# Patient Record
Sex: Male | Born: 1955 | Race: White | Hispanic: No | Marital: Single | Smoking: Former smoker
Health system: Southern US, Community
[De-identification: ages and names within clinical notes are randomized; demographics above are authoritative.]

## PROBLEM LIST (undated history)

## (undated) DIAGNOSIS — Z973 Presence of spectacles and contact lenses: Secondary | ICD-10-CM

## (undated) DIAGNOSIS — Z8719 Personal history of other diseases of the digestive system: Secondary | ICD-10-CM

## (undated) HISTORY — PX: LAPAROSCOPIC INGUINAL HERNIA REPAIR: SUR788

---

## 2001-10-06 ENCOUNTER — Encounter: Admission: RE | Admit: 2001-10-06 | Discharge: 2001-10-06 | Payer: Self-pay | Admitting: Urology

## 2001-10-06 ENCOUNTER — Encounter: Payer: Self-pay | Admitting: Urology

## 2004-06-01 ENCOUNTER — Ambulatory Visit (HOSPITAL_COMMUNITY): Admission: RE | Admit: 2004-06-01 | Discharge: 2004-06-01 | Payer: Self-pay | Admitting: Urology

## 2005-05-17 ENCOUNTER — Encounter: Admission: RE | Admit: 2005-05-17 | Discharge: 2005-05-17 | Payer: Self-pay | Admitting: Gastroenterology

## 2006-06-12 IMAGING — CT CT ABDOMEN W/ CM
1 of 3 series · 14 of 32 positions shown, 19 images · IV contrast (REDICAT/H2O & OMNIPAQUE [ID])
Comparison: None.

CLINICAL DATA: Left lower quadrant pain for one year.  No history of malignancy or relevant prior surgery. 
ABDOMEN CT WITH CONTRAST:
TECHNIQUE: Multidetector CT imaging of the abdomen was performed following the standard protocol during bolus administration of intravenous contrast.
Contrast:  100 cc Omnipaque 300.  Oral contrast was given.
TECHNIQUE: Multidetector CT imaging of the pelvis was performed following the standard protocol during bolus administration of intravenous contrast.

[Series 2: — · axial · 0.70mm/px · z∈[-377,-22]mm · 14 of 81 slices shown, 19 images]
[im 5/81  soft-tissue]
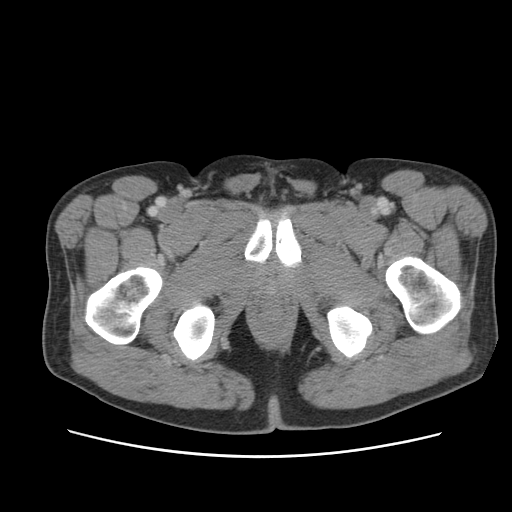
[im 5/81  bone]
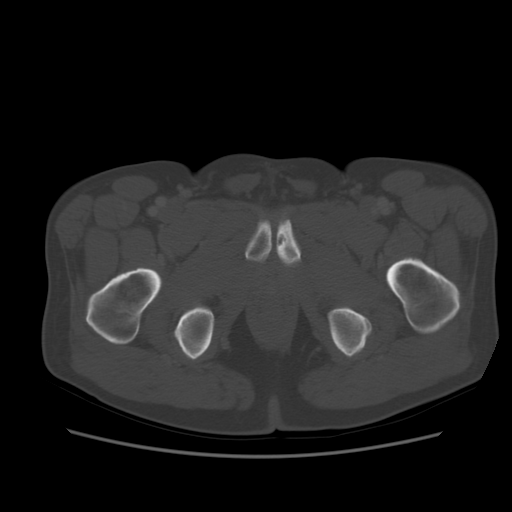
[im 13/81  soft-tissue]
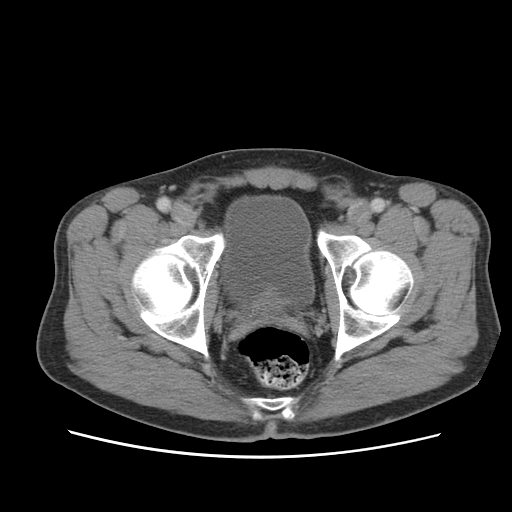
[im 17/81  soft-tissue]
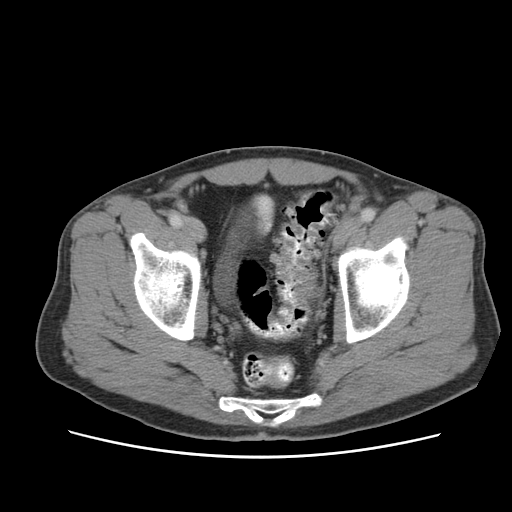
[im 22/81  soft-tissue]
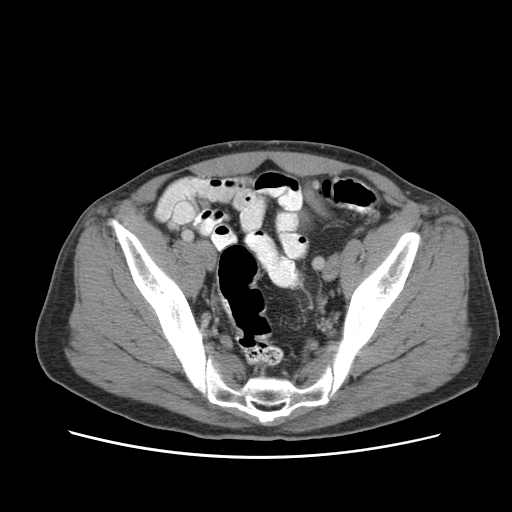
[im 30/81  soft-tissue]
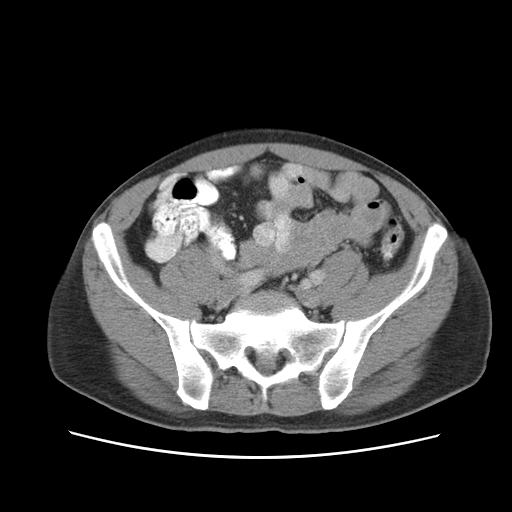
[im 34/81  soft-tissue]
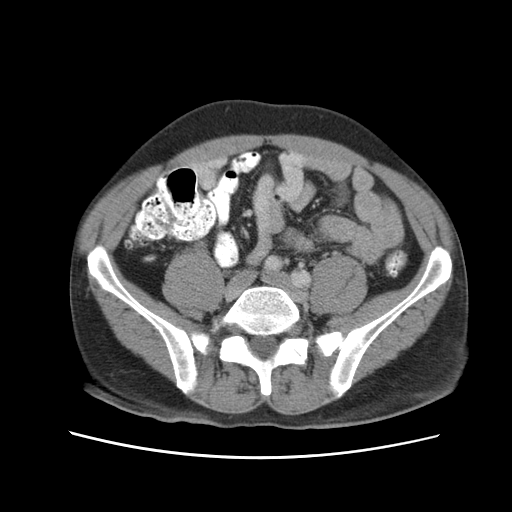
[im 43/81  soft-tissue]
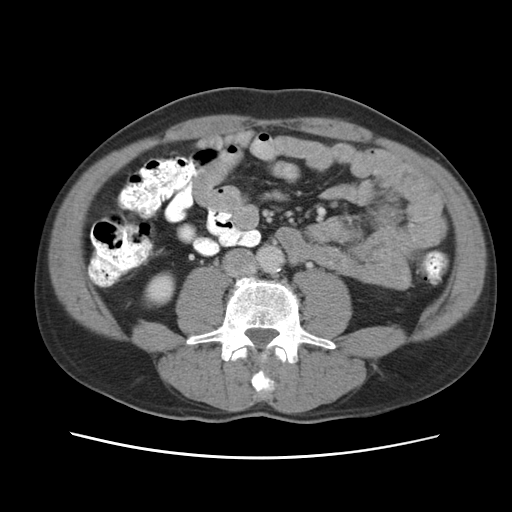
[im 47/81  soft-tissue]
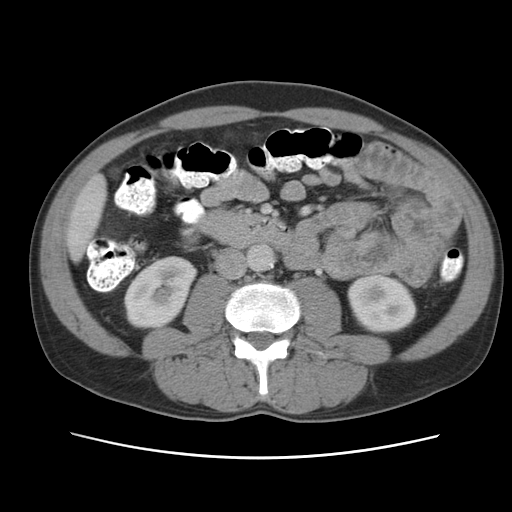
[im 51/81  soft-tissue]
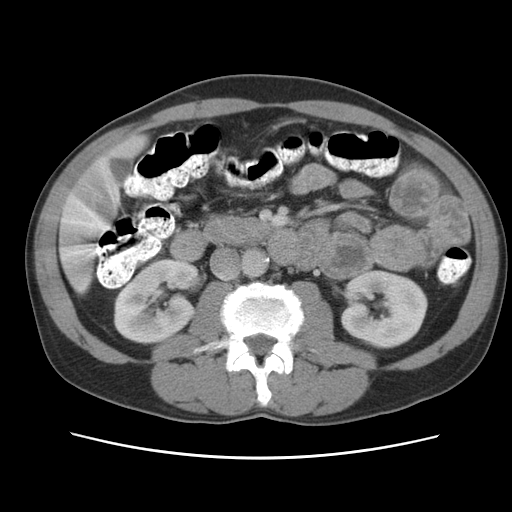
[im 51/81  bone]
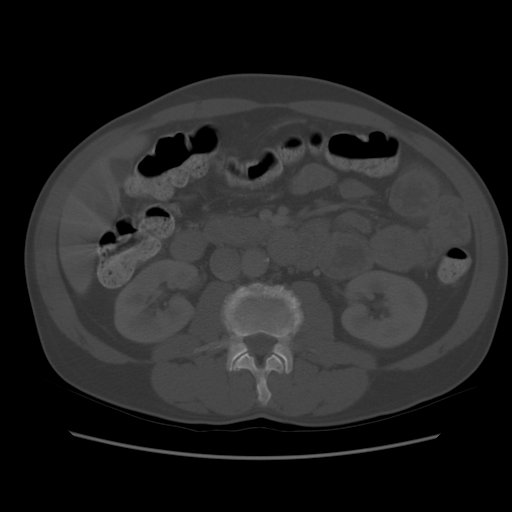
[im 59/81  soft-tissue]
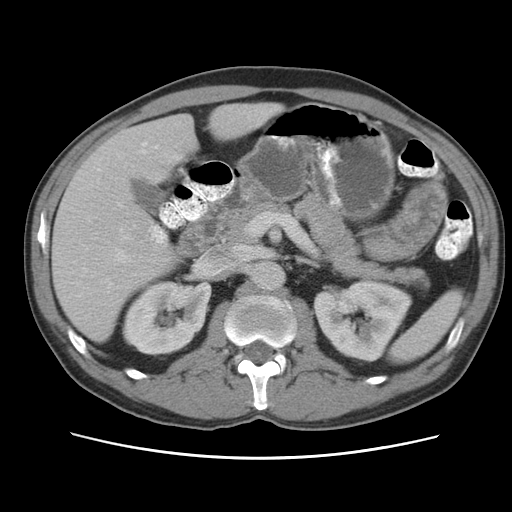
[im 64/81  soft-tissue]
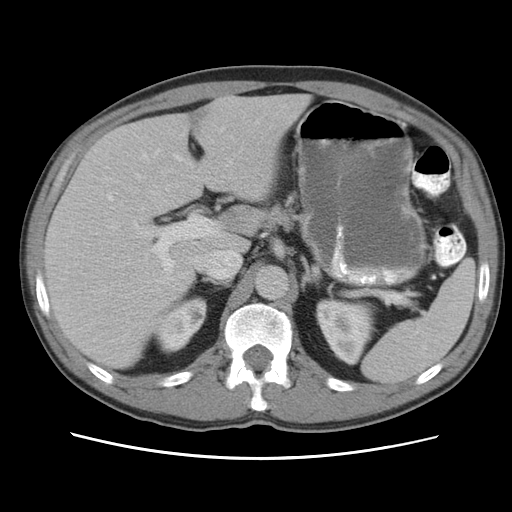
[im 64/81  lung]
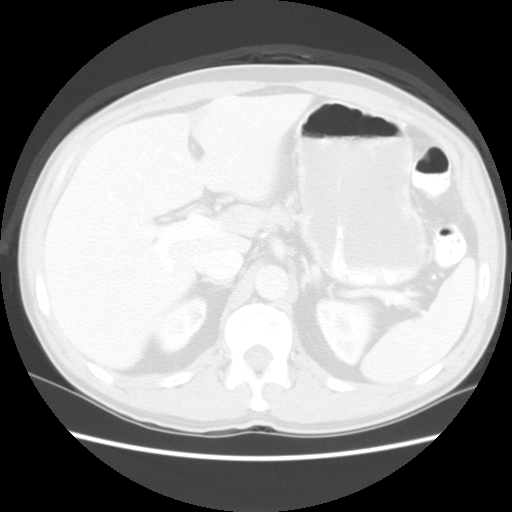
[im 68/81  soft-tissue]
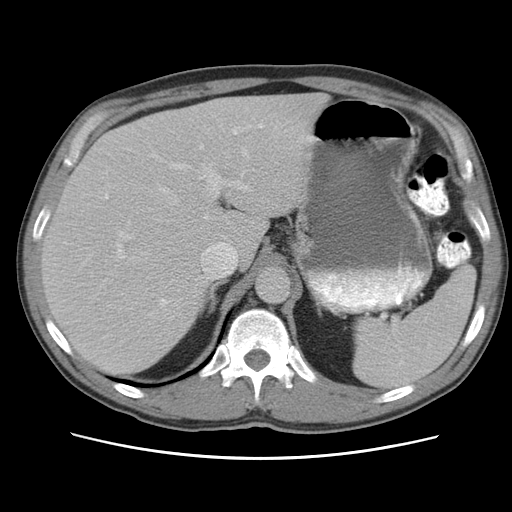
[im 68/81  lung]
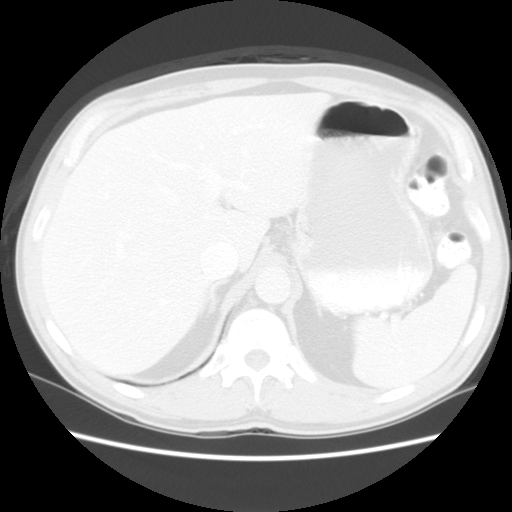
[im 72/81  lung]
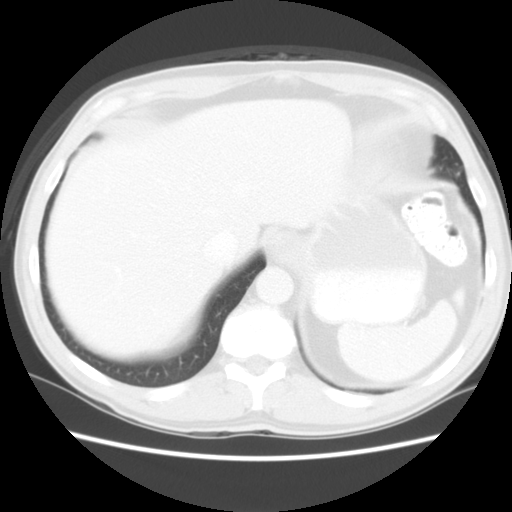
[im 76/81  soft-tissue]
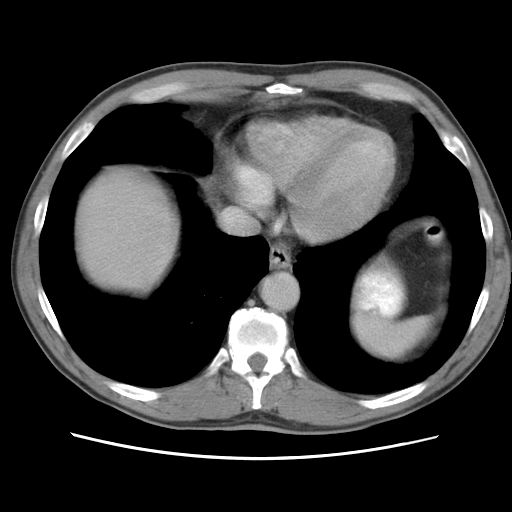
[im 76/81  lung]
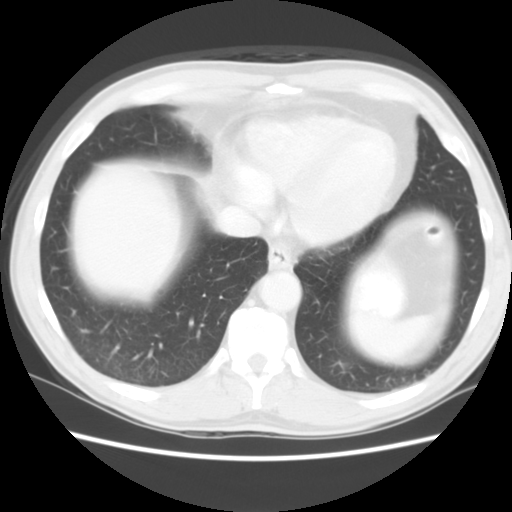

[14 of 32 positions shown; findings below may reference images not displayed]

FINDINGS: Images through the lung bases demonstrate apparent mild fibrotic changes in both lower lobes.  There is no consolidation or pleural effusion.  There is low density in the left hepatic lobe adjacent to the falciform ligament, a typical location for focal fat.  The liver otherwise appears normal.  The spleen, pancreas, gallbladder, and adrenal glands appear normal.  The kidneys appear normal aside from a 5 mm cyst in the upper pole of the right kidney.  There are no enlarged abdominal lymph nodes.  Appendix appears normal.
IMPRESSION: No acute or significant intraabdominal findings.  ild fibrosis at both lung bases. 
PELVIS CT WITH CONTRAST:
FINDINGS: There are moderate diverticular changes of the sigmoid colon with mild mural thickening and mild stranding in the adjacent fat.  Findings are suspicious for mild diverticulitis.  No peridiverticular abscess is identified.  There is no adenopathy.  Prostate gland, seminal vesicles, and urinary bladder appear unremarkable.  There are mild degenerative changes of the symphysis pubis.
IMPRESSION: 1.  Findings compatible with mild sigmoid diverticulitis.  There is no evidence of abscess.  
2.  Followup sigmoidoscopy is suggested to exclude an underlying mucosal lesion given the asymmetric wall thickening.

## 2009-03-21 ENCOUNTER — Encounter: Admission: RE | Admit: 2009-03-21 | Discharge: 2009-03-21 | Payer: Self-pay | Admitting: General Surgery

## 2010-04-16 IMAGING — CR DG CHEST 2V
2 series · 2 of 2 positions shown · non-contrast
Comparison: 06/01/2004

CLINICAL DATA: Preop evaluation, inguinal hernia repair

CHEST - 2 VIEW

[w chest pa]
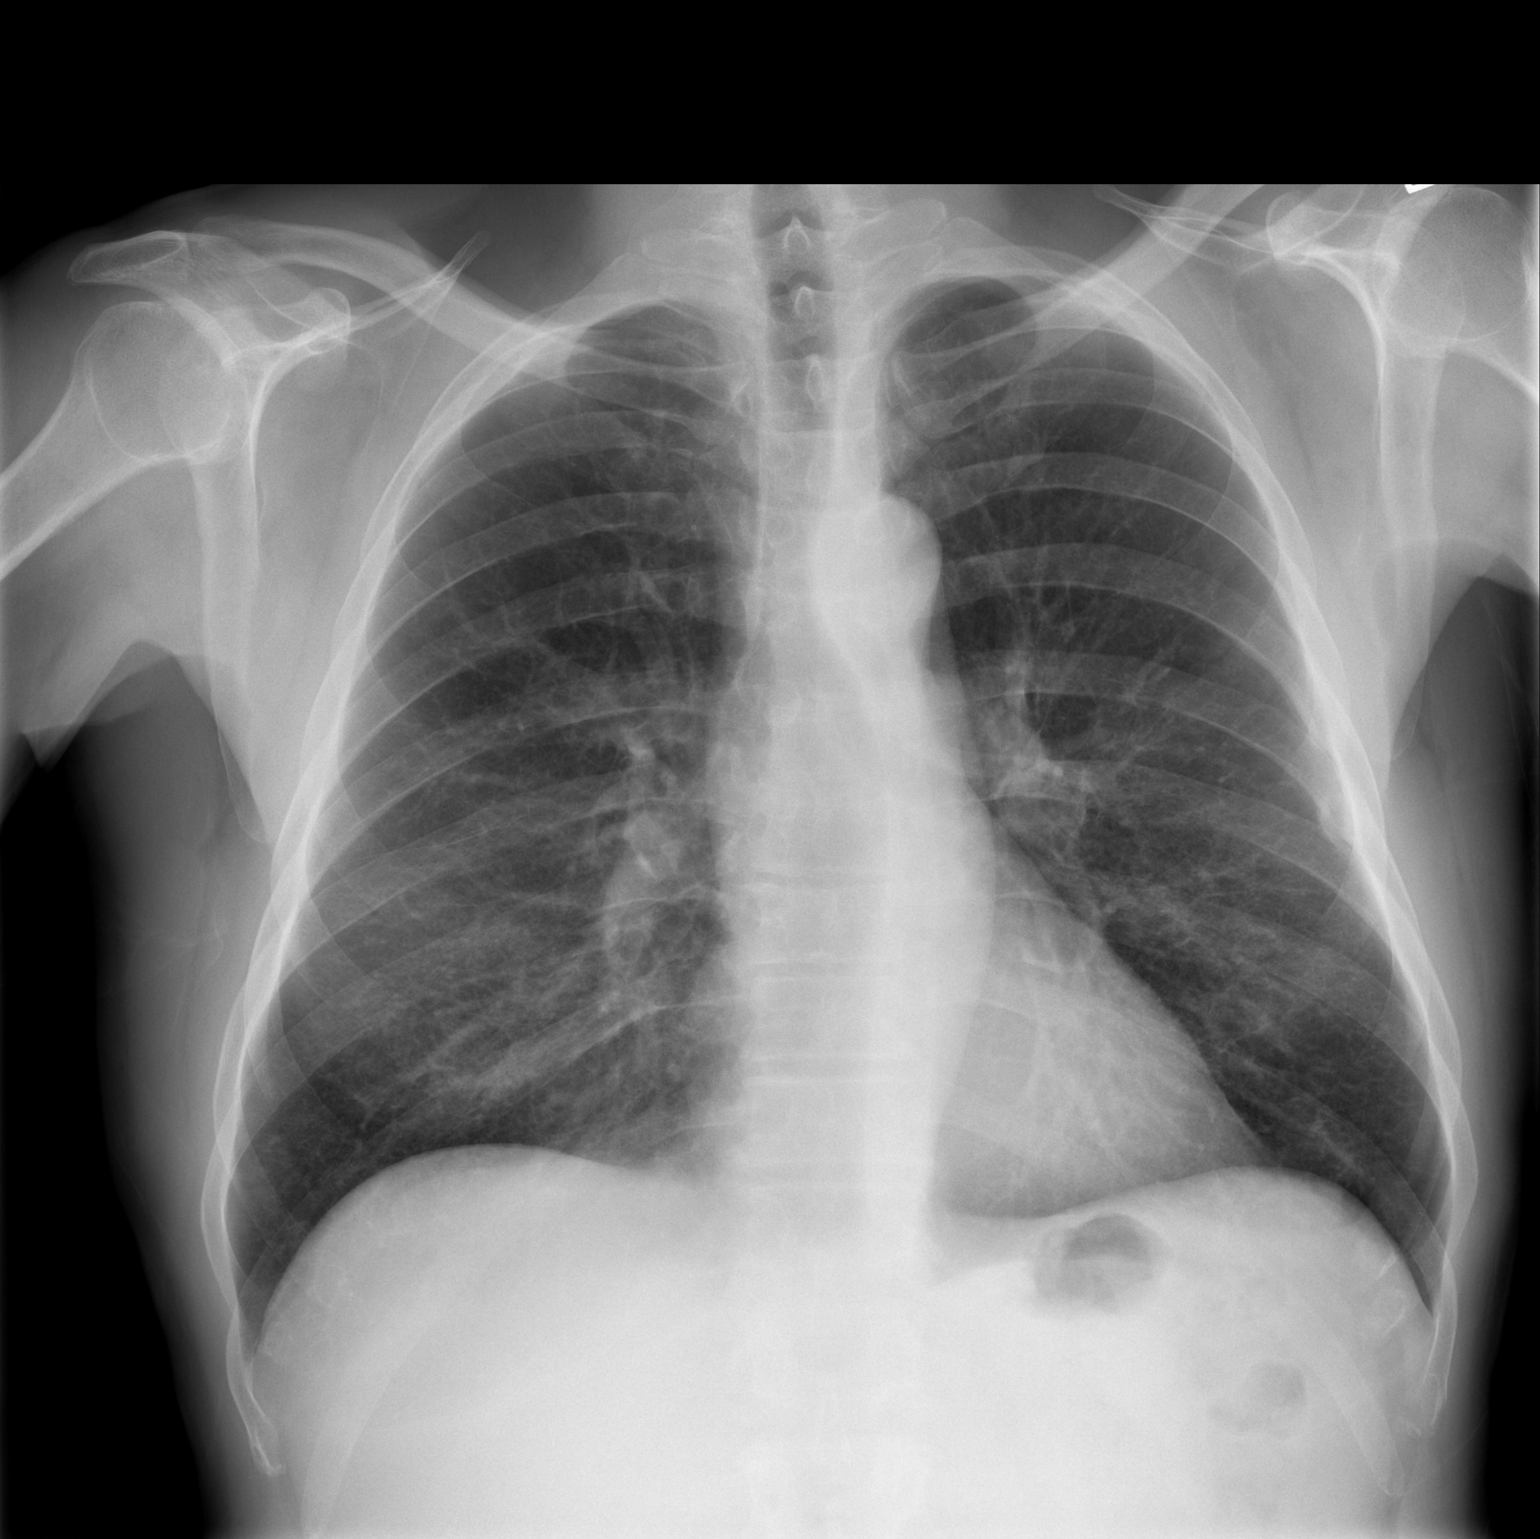

[w chest lat]
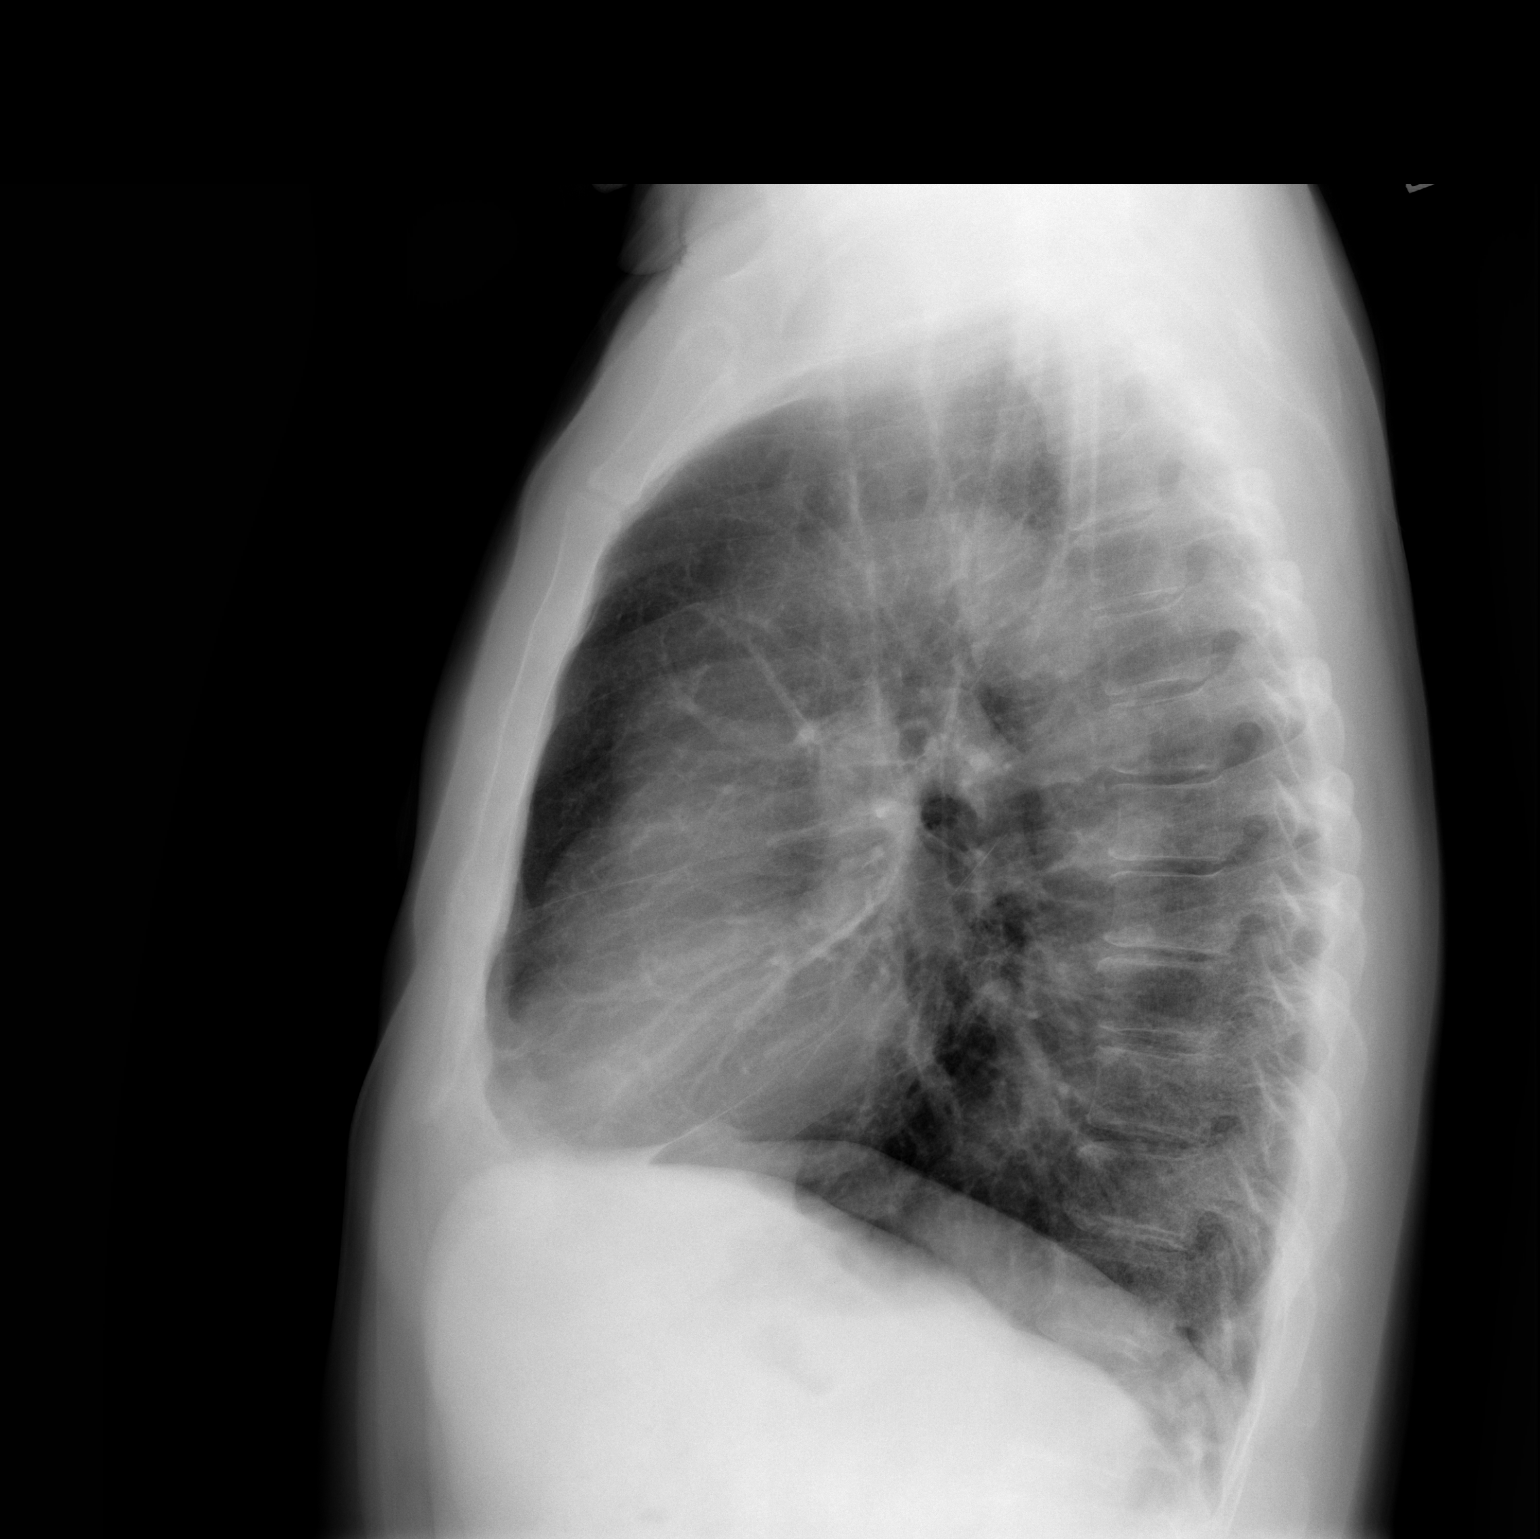

[2 of 2 positions shown; findings below may reference images not displayed]

FINDINGS: The heart size and mediastinal contours are within
normal limits.  Both lungs are clear.  The visualized skeletal
structures are unremarkable. Healing left fifth, sixth and seventh
rib fractures noted.  No pneumothorax or effusion.
IMPRESSION: No active cardiopulmonary disease.

## 2019-08-30 ENCOUNTER — Encounter: Payer: Self-pay | Admitting: Surgery

## 2019-08-30 ENCOUNTER — Ambulatory Visit: Payer: Self-pay | Admitting: Surgery

## 2019-08-30 DIAGNOSIS — K4090 Unilateral inguinal hernia, without obstruction or gangrene, not specified as recurrent: Secondary | ICD-10-CM | POA: Insufficient documentation

## 2019-08-30 NOTE — H&P (Signed)
Marily Memos Documented: 08/30/2019 10:24 AM Location: [facility] Patient #: [id number] DOB: [birth year] Single / Language: Cleophus Molt / Race: White Male  History of Present Illness Adin Hector MD; 08/30/2019 10:52 AM) The patient is a 64 year old male who presents with an inguinal hernia. Note for "Inguinal hernia": ` ` ` Patient sent for surgical consultation at the request of Dr Irine Seal, [facility]  Chief Complaint: Right groin bulging. Probable hernia. ` ` The patient is a pleasant male. Set issues with insurance and usually does not see doctors. However is now aged into to Medicare and is trying to establish with primary and specialty care. He saw urology for an elevated PSA. He used to see Dr. Serita Butcher who has since retired. Switch to Dr. Jeffie Pollock. Noted to have right inguinal hernia. Surgical consultation recommended. Patient had a left inguinal hernia repaired in an open fashion by Dr. Barry Dienes with our group in 2010. He has a distant history of some intermittent abdominal pain concerning for possible diverticulitis although I do not know if it's ever been proven or documented. Certainly not for several years.  Noted that he's had bulging there for several months. He thinks it's gradually gotten larger. Walk a half hour without difficulty. 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 Tries to avoid heavy lifting. He does smoke but has cut back to about 5 cigarettes a day. Usually moves his bowels about twice a day. Her heart attack. No issues with diabetes. No major problems with urination or defecation.  (Review of systems as stated in this history (HPI) or in the review of systems. Otherwise all other 12 point ROS are negative) ` ` `   Past Surgical History (Tanisha A. Owens Shark, [place]; 08/30/2019 10:24 AM) Colon Polyp Removal - Colonoscopy Laparoscopic Inguinal Hernia Surgery Left.  Diagnostic Studies History (Tanisha A. Owens Shark, [place];  08/30/2019 10:24 AM) Colonoscopy >10 years ago  Allergies (Tanisha A. Owens Shark, [place]; 08/30/2019 10:24 AM) No Known Drug Allergies [08/30/2019]: Allergies Reconciled  Medication History (Tanisha A. Owens Shark, [place]; 08/30/2019 10:25 AM) No Current Medications Medications Reconciled  Social History (Tanisha A. Owens Shark, [place]; 08/30/2019 10:24 AM) Alcohol use Occasional alcohol use. Caffeine use Coffee. Illicit drug use Remotely quit drug use. Tobacco use Current some day smoker.  Other Problems (Tanisha A. Owens Shark, [place]; 08/30/2019 10:24 AM) Diverticulosis Inguinal Hernia     Review of Systems (Tanisha A. Brown RMA; 08/30/2019 10:24 AM) General Not Present- Appetite Loss, Chills, Fatigue, Fever, Night Sweats, Weight Gain and Weight Loss. Skin Not Present- Change in Wart/Mole, Dryness, Hives, Jaundice, New Lesions, Non-Healing Wounds, Rash and Ulcer. HEENT Not Present- Earache, Hearing Loss, Hoarseness, Nose Bleed, Oral Ulcers, Ringing in the Ears, Seasonal Allergies, Sinus Pain, Sore Throat, Visual Disturbances, Wears glasses/contact lenses and Yellow Eyes. Respiratory Not Present- Bloody sputum, Chronic Cough, Difficulty Breathing, Snoring and Wheezing. Breast Not Present- Breast Mass, Breast Pain, Nipple Discharge and Skin Changes. Cardiovascular Not Present- Chest Pain, Difficulty Breathing Lying Down, Leg Cramps, Palpitations, Rapid Heart Rate, Shortness of Breath and Swelling of Extremities. Gastrointestinal Not Present- Abdominal Pain, Bloating, Bloody Stool, Change in Bowel Habits, Chronic diarrhea, Constipation, Difficulty Swallowing, Excessive gas, Gets full quickly at meals, Hemorrhoids, Indigestion, Nausea, Rectal Pain and Vomiting. Male Genitourinary Not Present- Blood in Urine, Change in Urinary Stream, Frequency, Impotence, Nocturia, Painful Urination, Urgency and Urine Leakage. Musculoskeletal Not Present- Back Pain, Joint Pain, Joint Stiffness, Muscle Pain, Muscle Weakness and  Swelling of Extremities. Neurological Not Present- Decreased Memory, Fainting,  Headaches, Numbness, Seizures, Tingling, Tremor, Trouble walking and Weakness. Psychiatric Not Present- Anxiety, Bipolar, Change in Sleep Pattern, Depression, Fearful and Frequent crying. Hematology Not Present- Blood Thinners, Easy Bruising, Excessive bleeding, Gland problems, HIV and Persistent Infections.  Vitals (Tanisha A. Brown RMA; 08/30/2019 10:25 AM) 08/30/2019 10:25 AM Weight: 151 lb Height: 68in Body Surface Area: 1.81 m Body Mass Index: 22.96 kg/m  Temp.: 97.45F  Pulse: 101 (Regular)  BP: 126/78 (Sitting, Left Arm, Standard)        Physical Exam Adin Hector MD; 08/30/2019 10:44 AM)  General Mental Status-Alert. General Appearance-Not in acute distress, Not Sickly. Orientation-Oriented X3. Hydration-Well hydrated. Voice-Normal. Note: Pleasant, chatty  Integumentary Global Assessment Upon inspection and palpation of skin surfaces of the - Axillae: non-tender, no inflammation or ulceration, no drainage. and Distribution of scalp and body hair is normal. General Characteristics Temperature - normal warmth is noted.  Head and Neck Head-normocephalic, atraumatic with no lesions or palpable masses. Face Global Assessment - atraumatic, no absence of expression. Neck Global Assessment - no abnormal movements, no bruit auscultated on the right, no bruit auscultated on the left, no decreased range of motion, non-tender. Trachea-midline. Thyroid Gland Characteristics - non-tender.  Eye Eyeball - Left-Extraocular movements intact, No Nystagmus - Left. Eyeball - Right-Extraocular movements intact, No Nystagmus - Right. Cornea - Left-No Hazy - Left. Cornea - Right-No Hazy - Right. Sclera/Conjunctiva - Left-No scleral icterus, No Discharge - Left. Sclera/Conjunctiva - Right-No scleral icterus, No Discharge - Right. Pupil - Left-Direct reaction to  light normal. Pupil - Right-Direct reaction to light normal. Note: Wears glasses. Vision corrected  ENMT Ears Pinna - Left - no drainage observed, no generalized tenderness observed. Pinna - Right - no drainage observed, no generalized tenderness observed. Nose and Sinuses External Inspection of the Nose - no destructive lesion observed. Inspection of the nares - Left - quiet respiration. Inspection of the nares - Right - quiet respiration. Mouth and Throat Lips - Upper Lip - no fissures observed, no pallor noted. Lower Lip - no fissures observed, no pallor noted. Nasopharynx - no discharge present. Oral Cavity/Oropharynx - Tongue - no dryness observed. Oral Mucosa - no cyanosis observed. Hypopharynx - no evidence of airway distress observed.  Chest and Lung Exam Inspection Movements - Normal and Symmetrical. Accessory muscles - No use of accessory muscles in breathing. Palpation Palpation of the chest reveals - Non-tender. Auscultation Breath sounds - Normal and Clear.  Cardiovascular Auscultation Rhythm - Regular. Murmurs & Other Heart Sounds - Auscultation of the heart reveals - No Murmurs and No Systolic Clicks.  Abdomen Inspection Inspection of the abdomen reveals - No Visible peristalsis and No Abnormal pulsations. Umbilicus - No Bleeding, No Urine drainage. Palpation/Percussion Palpation and Percussion of the abdomen reveal - Soft, Non Tender, No Rebound tenderness, No Rigidity (guarding) and No Cutaneous hyperesthesia. Note: Abdomen soft. Nontender. Not distended. No umbilical or incisional hernias. No guarding.  Male Genitourinary Sexual Maturity Tanner 5 - Adult hair pattern and Adult penile size and shape. Note: Obvious right groin bulging going down into upper scrotum consistent with reducible inguinal hernia. Mild laxity and left external ring but no definite recurrent hernia.  Otherwise normal external male genitalia without any testicular  masses.  Peripheral Vascular Upper Extremity Inspection - Left - No Cyanotic nailbeds - Left, Not Ischemic. Inspection - Right - No Cyanotic nailbeds - Right, Not Ischemic.  Neurologic Neurologic evaluation reveals -normal attention span and ability to concentrate, able to name objects and repeat phrases. Appropriate  fund of knowledge , normal sensation and normal coordination. Mental Status Affect - not angry, not paranoid. Cranial Nerves-Normal Bilaterally. Gait-Normal.  Neuropsychiatric Mental status exam performed with findings of-able to articulate well with normal speech/language, rate, volume and coherence, thought content normal with ability to perform basic computations and apply abstract reasoning and no evidence of hallucinations, delusions, obsessions or homicidal/suicidal ideation.  Musculoskeletal Global Assessment Spine, Ribs and Pelvis - no instability, subluxation or laxity. Right Upper Extremity - no instability, subluxation or laxity. Note: Fixed flexion of the right pinky finger  Lymphatic Head & Neck  General Head & Neck Lymphatics: Bilateral - Description - No Localized lymphadenopathy. Axillary  General Axillary Region: Bilateral - Description - No Localized lymphadenopathy. Femoral & Inguinal  Generalized Femoral & Inguinal Lymphatics: Left - Description - No Localized lymphadenopathy. Right - Description - No Localized lymphadenopathy.    Assessment & Plan Adin Hector MD; 08/30/2019 10:53 AM)  RIGHT INGUINAL HERNIA (K40.90) Impression: Obvious right inguinal hernia patient with a prior left inguinal hernia repair.  I think he would benefit from surgery. Reasonable laparoscopic preperitoneal approach. Rule out recurrence on the left side since there is some laxity is there as well. He is interested in proceeding.  Quit smoking to minimize recurrence, pain, infection.   35 minutes spent in chart review, evaluation, examination,  counseling, and coordination of care. More than 50% of that time was spent in counseling.   PREOP - ING HERNIA - ENCOUNTER FOR PREOPERATIVE EXAMINATION FOR GENERAL SURGICAL PROCEDURE (Z01.818)  Current Plans You are being scheduled for surgery- Our schedulers will call you.  You should hear from our office's scheduling department within 5 working days about the location, date, and time of surgery. We try to make accommodations for patient's preferences in scheduling surgery, but sometimes the OR schedule or the surgeon's schedule prevents [place] from making those accommodations.  If you have not heard from our office [phone]) in 5 working days, call the office and ask for your surgeon's nurse.  If you have other questions about your diagnosis, plan, or surgery, call the office and ask for your surgeon's nurse.  Written instructions provided The anatomy & physiology of the abdominal wall and pelvic floor was discussed. The pathophysiology of hernias in the inguinal and pelvic region was discussed. Natural history risks such as progressive enlargement, pain, incarceration, and strangulation was discussed. Contributors to complications such as smoking, obesity, diabetes, prior surgery, etc were discussed.  I feel the risks of no intervention will lead to serious problems that outweigh the operative risks; therefore, I recommended surgery to reduce and repair the hernia. I explained laparoscopic techniques with possible need for an open approach. I noted usual use of mesh to patch and/or buttress hernia repair  Risks such as bleeding, infection, abscess, need for further treatment, heart attack, death, and other risks were discussed. I noted a good likelihood this will help address the problem. Goals of post-operative recovery were discussed as well. Possibility that this will not correct all symptoms was explained. I stressed the importance of low-impact activity, aggressive pain  control, avoiding constipation, & not pushing through pain to minimize risk of post-operative chronic pain or injury. Possibility of reherniation was discussed. We will work to minimize complications.  An educational handout further explaining the pathology & treatment options was given as well. Questions were answered. The patient expresses understanding & wishes to proceed with surgery.  Pt Education - Pamphlet Given - Laparoscopic Hernia Repair: discussed with patient and  provided information. Pt Education - [facility] Pain Control (Genea Rheaume) Pt Education - [facility] Hernia Post-Op HCI (Jahzeel Poythress): discussed with patient and provided information. Pt Education - [facility] Mesh education: discussed with patient and provided information.  TOBACCO ABUSE (Z72.0) Impression: STOP SMOKING!  We talked to the patient about the dangers of smoking. We stressed that tobacco use dramatically increases the risk of peri-operative complications such as infection, tissue necrosis leaving to problems with incision/wound and organ healing, hernia, chronic pain, heart attack, stroke, DVT, pulmonary embolism, and death. We noted there are programs in our community to help stop smoking. Information was available.  Current Plans Pt Education - [facility] STOP SMOKING!

## 2019-10-06 ENCOUNTER — Other Ambulatory Visit: Payer: Self-pay

## 2019-10-06 ENCOUNTER — Encounter (HOSPITAL_BASED_OUTPATIENT_CLINIC_OR_DEPARTMENT_OTHER): Payer: Self-pay | Admitting: Surgery

## 2019-10-06 NOTE — Progress Notes (Signed)
Spoke w/ via phone for pre-op interview--- PT Lab needs dos---- none              Lab results------ no COVID test ------ 10-09-2019 @ 1000 Arrive at ------- [id number] NPO after ------  MN Medications to take morning of surgery ----- NONE Diabetic medication ----- n/a Patient Special Instructions ----- n/a Pre-Op special Istructions ----- n/a Patient verbalized understanding of instructions that were given at this phone interview. Patient denies shortness of breath, chest pain, fever, cough a this phone interview.

## 2019-10-09 ENCOUNTER — Other Ambulatory Visit (HOSPITAL_COMMUNITY)
Admission: RE | Admit: 2019-10-09 | Discharge: 2019-10-09 | Disposition: A | Discharge status: Home / Self Care | Payer: Medicare HMO | Source: Ambulatory Visit | Attending: Surgery | Admitting: Surgery

## 2019-10-09 DIAGNOSIS — Z01812 Encounter for preprocedural laboratory examination: Secondary | ICD-10-CM | POA: Diagnosis present

## 2019-10-09 DIAGNOSIS — Z20822 Contact with and (suspected) exposure to covid-19: Secondary | ICD-10-CM | POA: Insufficient documentation

## 2019-10-09 LAB — SARS CORONAVIRUS 2 (TAT 6-24 HRS): SARS Coronavirus 2: NEGATIVE

## 2019-10-12 MED ORDER — BUPIVACAINE LIPOSOME 1.3 % IJ SUSP
20.0000 mL | INTRAMUSCULAR | Status: DC
  Filled 2019-10-12 (×2): qty 20

## 2019-10-13 ENCOUNTER — Ambulatory Visit (HOSPITAL_BASED_OUTPATIENT_CLINIC_OR_DEPARTMENT_OTHER): Payer: Medicare HMO | Admitting: Anesthesiology

## 2019-10-13 ENCOUNTER — Other Ambulatory Visit: Payer: Self-pay

## 2019-10-13 ENCOUNTER — Ambulatory Visit (HOSPITAL_BASED_OUTPATIENT_CLINIC_OR_DEPARTMENT_OTHER)
Admission: RE | Admit: 2019-10-13 | Discharge: 2019-10-13 | Disposition: A | Discharge status: Home / Self Care | Payer: Medicare HMO | Attending: Surgery | Admitting: Surgery

## 2019-10-13 ENCOUNTER — Encounter (HOSPITAL_BASED_OUTPATIENT_CLINIC_OR_DEPARTMENT_OTHER)
Admission: RE | Disposition: A | Discharge status: Home / Self Care | Payer: Self-pay | Source: Home / Self Care | Attending: Surgery

## 2019-10-13 ENCOUNTER — Encounter (HOSPITAL_BASED_OUTPATIENT_CLINIC_OR_DEPARTMENT_OTHER): Payer: Self-pay | Admitting: Surgery

## 2019-10-13 DIAGNOSIS — D176 Benign lipomatous neoplasm of spermatic cord: Secondary | ICD-10-CM | POA: Insufficient documentation

## 2019-10-13 DIAGNOSIS — K4120 Bilateral femoral hernia, without obstruction or gangrene, not specified as recurrent: Secondary | ICD-10-CM | POA: Insufficient documentation

## 2019-10-13 DIAGNOSIS — F17210 Nicotine dependence, cigarettes, uncomplicated: Secondary | ICD-10-CM | POA: Insufficient documentation

## 2019-10-13 DIAGNOSIS — K4090 Unilateral inguinal hernia, without obstruction or gangrene, not specified as recurrent: Secondary | ICD-10-CM | POA: Diagnosis not present

## 2019-10-13 HISTORY — DX: Personal history of other diseases of the digestive system: Z87.19

## 2019-10-13 HISTORY — PX: INGUINAL HERNIA REPAIR: SHX194

## 2019-10-13 HISTORY — DX: Presence of spectacles and contact lenses: Z97.3

## 2019-10-13 SURGERY — REPAIR, HERNIA, INGUINAL, LAPAROSCOPIC
Anesthesia: General | Site: Abdomen | Laterality: Bilateral

## 2019-10-13 MED ORDER — FENTANYL CITRATE (PF) 250 MCG/5ML IJ SOLN
INTRAMUSCULAR | Status: AC
  Filled 2019-10-13: qty 5

## 2019-10-13 MED ORDER — ACETAMINOPHEN 500 MG PO TABS
1000.0000 mg | ORAL_TABLET | Freq: Once | ORAL | Status: AC
  Administered 2019-10-13: 12:00:00 1000 mg via ORAL
  Filled 2019-10-13: qty 2

## 2019-10-13 MED ORDER — MIDAZOLAM HCL 2 MG/2ML IJ SOLN
INTRAMUSCULAR | Status: AC
  Filled 2019-10-13: qty 2

## 2019-10-13 MED ORDER — LACTATED RINGERS IV SOLN
INTRAVENOUS | Status: DC
  Administered 2019-10-13: 50 mL/h via INTRAVENOUS
  Filled 2019-10-13: qty 1000

## 2019-10-13 MED ORDER — BUPIVACAINE LIPOSOME 1.3 % IJ SUSP
INTRAMUSCULAR | Status: DC | PRN
  Administered 2019-10-13: 20 mL

## 2019-10-13 MED ORDER — GABAPENTIN 300 MG PO CAPS
300.0000 mg | ORAL_CAPSULE | ORAL | Status: AC
  Administered 2019-10-13: 12:00:00 300 mg via ORAL
  Filled 2019-10-13: qty 1

## 2019-10-13 MED ORDER — PROMETHAZINE HCL 25 MG/ML IJ SOLN
6.2500 mg | INTRAMUSCULAR | Status: DC | PRN
  Filled 2019-10-13: qty 1

## 2019-10-13 MED ORDER — OXYCODONE HCL 5 MG PO TABS
ORAL_TABLET | ORAL | Status: AC
  Filled 2019-10-13: qty 1

## 2019-10-13 MED ORDER — GABAPENTIN 300 MG PO CAPS
ORAL_CAPSULE | ORAL | Status: AC
  Filled 2019-10-13: qty 1

## 2019-10-13 MED ORDER — OXYCODONE HCL 5 MG/5ML PO SOLN
5.0000 mg | Freq: Once | ORAL | Status: AC | PRN
  Filled 2019-10-13: qty 5

## 2019-10-13 MED ORDER — HYDROMORPHONE HCL 1 MG/ML IJ SOLN
0.2500 mg | INTRAMUSCULAR | Status: DC | PRN
  Administered 2019-10-13: 0.5 mg via INTRAVENOUS
  Filled 2019-10-13: qty 0.5

## 2019-10-13 MED ORDER — CEFAZOLIN SODIUM-DEXTROSE 2-4 GM/100ML-% IV SOLN
INTRAVENOUS | Status: AC
  Filled 2019-10-13: qty 100

## 2019-10-13 MED ORDER — KETOROLAC TROMETHAMINE 30 MG/ML IJ SOLN
30.0000 mg | Freq: Once | INTRAMUSCULAR | Status: DC | PRN
  Filled 2019-10-13: qty 1

## 2019-10-13 MED ORDER — FENTANYL CITRATE (PF) 100 MCG/2ML IJ SOLN
INTRAMUSCULAR | Status: AC
  Filled 2019-10-13: qty 2

## 2019-10-13 MED ORDER — ONDANSETRON HCL 4 MG/2ML IJ SOLN
INTRAMUSCULAR | Status: DC | PRN
  Administered 2019-10-13: 4 mg via INTRAVENOUS

## 2019-10-13 MED ORDER — HYDROMORPHONE HCL 1 MG/ML IJ SOLN
INTRAMUSCULAR | Status: AC
  Filled 2019-10-13: qty 1

## 2019-10-13 MED ORDER — CHLORHEXIDINE GLUCONATE CLOTH 2 % EX PADS
6.0000 | MEDICATED_PAD | Freq: Once | CUTANEOUS | Status: DC
  Filled 2019-10-13: qty 6

## 2019-10-13 MED ORDER — CEFAZOLIN SODIUM-DEXTROSE 2-4 GM/100ML-% IV SOLN
2.0000 g | INTRAVENOUS | Status: AC
  Administered 2019-10-13: 14:00:00 2 g via INTRAVENOUS
  Filled 2019-10-13: qty 100

## 2019-10-13 MED ORDER — MEPERIDINE HCL 25 MG/ML IJ SOLN
6.2500 mg | INTRAMUSCULAR | Status: DC | PRN
  Filled 2019-10-13: qty 1

## 2019-10-13 MED ORDER — PROPOFOL 10 MG/ML IV BOLUS
INTRAVENOUS | Status: DC | PRN
  Administered 2019-10-13: 120 mg via INTRAVENOUS

## 2019-10-13 MED ORDER — CELECOXIB 200 MG PO CAPS
200.0000 mg | ORAL_CAPSULE | ORAL | Status: AC
  Administered 2019-10-13: 12:00:00 200 mg via ORAL
  Filled 2019-10-13: qty 1

## 2019-10-13 MED ORDER — FENTANYL CITRATE (PF) 250 MCG/5ML IJ SOLN
INTRAMUSCULAR | Status: DC | PRN
  Administered 2019-10-13 (×2): 100 ug via INTRAVENOUS
  Administered 2019-10-13: 50 ug via INTRAVENOUS

## 2019-10-13 MED ORDER — SUGAMMADEX SODIUM 200 MG/2ML IV SOLN
INTRAVENOUS | Status: DC | PRN
  Administered 2019-10-13: 150 mg via INTRAVENOUS

## 2019-10-13 MED ORDER — ACETAMINOPHEN 500 MG PO TABS
1000.0000 mg | ORAL_TABLET | ORAL | Status: AC
  Filled 2019-10-13: qty 2

## 2019-10-13 MED ORDER — LIDOCAINE HCL (CARDIAC) PF 100 MG/5ML IV SOSY
PREFILLED_SYRINGE | INTRAVENOUS | Status: DC | PRN
  Administered 2019-10-13: 40 mg via INTRAVENOUS

## 2019-10-13 MED ORDER — ROCURONIUM BROMIDE 100 MG/10ML IV SOLN
INTRAVENOUS | Status: DC | PRN
  Administered 2019-10-13 (×2): 10 mg via INTRAVENOUS
  Administered 2019-10-13: 20 mg via INTRAVENOUS
  Administered 2019-10-13: 70 mg via INTRAVENOUS

## 2019-10-13 MED ORDER — OXYCODONE HCL 5 MG PO TABS
5.0000 mg | ORAL_TABLET | Freq: Once | ORAL | Status: AC | PRN
  Administered 2019-10-13: 17:00:00 5 mg via ORAL
  Filled 2019-10-13: qty 1

## 2019-10-13 MED ORDER — MIDAZOLAM HCL 5 MG/5ML IJ SOLN
INTRAMUSCULAR | Status: DC | PRN
  Administered 2019-10-13: 2 mg via INTRAVENOUS

## 2019-10-13 MED ORDER — TRAMADOL HCL 50 MG PO TABS: 50.0000 mg | ORAL_TABLET | Freq: Four times a day (QID) | ORAL | 0 refills | Status: AC | PRN

## 2019-10-13 MED ORDER — DEXAMETHASONE SODIUM PHOSPHATE 4 MG/ML IJ SOLN
INTRAMUSCULAR | Status: DC | PRN
  Administered 2019-10-13: 10 mg via INTRAVENOUS

## 2019-10-13 MED ORDER — PROPOFOL 10 MG/ML IV BOLUS
INTRAVENOUS | Status: AC
  Filled 2019-10-13: qty 20

## 2019-10-13 MED ORDER — ROCURONIUM BROMIDE 10 MG/ML (PF) SYRINGE
PREFILLED_SYRINGE | INTRAVENOUS | Status: AC
  Filled 2019-10-13: qty 10

## 2019-10-13 MED ORDER — BUPIVACAINE-EPINEPHRINE 0.25% -1:200000 IJ SOLN
INTRAMUSCULAR | Status: DC | PRN
  Administered 2019-10-13: 40 mL

## 2019-10-13 MED ORDER — ACETAMINOPHEN 500 MG PO TABS
ORAL_TABLET | ORAL | Status: AC
  Filled 2019-10-13: qty 2

## 2019-10-13 MED ORDER — FENTANYL CITRATE (PF) 100 MCG/2ML IJ SOLN
INTRAMUSCULAR | Status: DC | PRN
  Administered 2019-10-13 (×2): 50 ug via INTRAVENOUS

## 2019-10-13 MED ORDER — CELECOXIB 200 MG PO CAPS
ORAL_CAPSULE | ORAL | Status: AC
  Filled 2019-10-13: qty 1

## 2019-10-13 SURGICAL SUPPLY — 47 items
APL PRP STRL LF DISP 70% ISPRP (MISCELLANEOUS) ×1
APL SWBSTK 6 STRL LF DISP (MISCELLANEOUS) ×1
APPLICATOR COTTON TIP 6 STRL (MISCELLANEOUS) IMPLANT
APPLICATOR COTTON TIP 6IN STRL (MISCELLANEOUS) ×3
CABLE HIGH FREQUENCY MONO STRZ (ELECTRODE) ×3 IMPLANT
CANISTER SUCT 3000ML PPV (MISCELLANEOUS) IMPLANT
CHLORAPREP W/TINT 26 (MISCELLANEOUS) ×3 IMPLANT
COVER WAND RF STERILE (DRAPES) ×3 IMPLANT
DECANTER SPIKE VIAL GLASS SM (MISCELLANEOUS) ×3 IMPLANT
DEVICE SECURE STRAP 25 ABSORB (INSTRUMENTS) IMPLANT
DRAPE WARM FLUID 44X44 (DRAPES) ×3 IMPLANT
DRSG TEGADERM 2-3/8X2-3/4 SM (GAUZE/BANDAGES/DRESSINGS) ×4 IMPLANT
DRSG TEGADERM 4X4.75 (GAUZE/BANDAGES/DRESSINGS) ×3 IMPLANT
ELECT REM PT RETURN 9FT ADLT (ELECTROSURGICAL) ×3
ELECTRODE REM PT RTRN 9FT ADLT (ELECTROSURGICAL) ×1 IMPLANT
GAUZE SPONGE 2X2 8PLY STRL LF (GAUZE/BANDAGES/DRESSINGS) IMPLANT
GLOVE ECLIPSE 8.0 STRL XLNG CF (GLOVE) ×3 IMPLANT
GLOVE INDICATOR 8.0 STRL GRN (GLOVE) ×3 IMPLANT
GOWN STRL REUS W/TWL XL LVL3 (GOWN DISPOSABLE) ×3 IMPLANT
IRRIG SUCT STRYKERFLOW 2 WTIP (MISCELLANEOUS)
IRRIGATION SUCT STRKRFLW 2 WTP (MISCELLANEOUS) IMPLANT
KIT TURNOVER CYSTO (KITS) ×3 IMPLANT
MANIFOLD NEPTUNE II (INSTRUMENTS) IMPLANT
MESH ULTRAPRO 6X6 15CM15CM (Mesh General) ×6 IMPLANT
NEEDLE HYPO 22GX1.5 SAFETY (NEEDLE) ×3 IMPLANT
NS IRRIG 500ML POUR BTL (IV SOLUTION) ×3 IMPLANT
PACK BASIN DAY SURGERY FS (CUSTOM PROCEDURE TRAY) ×3 IMPLANT
PAD POSITIONING PINK XL (MISCELLANEOUS) ×3 IMPLANT
SCISSORS LAP 5X35 DISP (ENDOMECHANICALS) ×3 IMPLANT
SET TUBE SMOKE EVAC HIGH FLOW (TUBING) ×3 IMPLANT
SLEEVE ADV FIXATION 5X100MM (TROCAR) ×3 IMPLANT
SLEEVE SURGEON STRL (DRAPES) ×2 IMPLANT
SPONGE GAUZE 2X2 8PLY STER LF (GAUZE/BANDAGES/DRESSINGS) ×3
SPONGE GAUZE 2X2 8PLY STRL LF (GAUZE/BANDAGES/DRESSINGS) ×6 IMPLANT
SPONGE GAUZE 2X2 STER 10/PKG (GAUZE/BANDAGES/DRESSINGS) ×2
SUT MNCRL AB 4-0 PS2 18 (SUTURE) ×3 IMPLANT
SUT PDS AB 1 CT1 27 (SUTURE) ×2 IMPLANT
SUT VIC AB 2-0 SH 27 (SUTURE) ×3
SUT VIC AB 2-0 SH 27XBRD (SUTURE) IMPLANT
SUT VICRYL 0 UR6 27IN ABS (SUTURE) IMPLANT
SYR TB 1ML LL NO SAFETY (SYRINGE) ×2 IMPLANT
TOWEL OR 17X26 10 PK STRL BLUE (TOWEL DISPOSABLE) ×3 IMPLANT
TRAY DSU PREP LF (CUSTOM PROCEDURE TRAY) ×3 IMPLANT
TRAY LAPAROSCOPIC (CUSTOM PROCEDURE TRAY) ×3 IMPLANT
TROCAR ADV FIXATION 5X100MM (TROCAR) ×3 IMPLANT
TROCAR XCEL BLUNT TIP 100MML (ENDOMECHANICALS) ×3 IMPLANT
WATER STERILE IRR 500ML POUR (IV SOLUTION) ×3 IMPLANT

## 2019-10-13 NOTE — Transfer of Care (Signed)
Immediate Anesthesia Transfer of Care Note  Patient: Anthony Duke  Procedure(s) Performed: LAPAROSCOPIC RIGHT  INGUINAL AND BILATERAL FEMORAL  HERNIA  REPAIR  WITH MESH (Bilateral Abdomen)  Patient Location: PACU  Anesthesia Type:General  Level of Consciousness: drowsy, patient cooperative and responds to stimulation  Airway & Oxygen Therapy: Patient Spontanous Breathing and Patient connected to face mask oxygen  Post-op Assessment: Report given to RN and Post -op Vital signs reviewed and stable  Post vital signs: Reviewed and stable  Last Vitals:  Vitals Value Taken Time  BP 166/91 10/13/19 1539  Temp    Pulse 95 10/13/19 1541  Resp 25 10/13/19 1541  SpO2 97 % 10/13/19 1541  Vitals shown include unvalidated device data.  Last Pain:  Vitals:   10/13/19 1201  TempSrc: Oral  PainSc: 0-No pain      Patients Stated Pain Goal: 5 ([id number])  Complications: No apparent anesthesia complications

## 2019-10-13 NOTE — Anesthesia Postprocedure Evaluation (Signed)
Anesthesia Post Note  Patient: Anthony Duke  Procedure(s) Performed: LAPAROSCOPIC RIGHT  INGUINAL AND BILATERAL FEMORAL  HERNIA  REPAIR  WITH MESH (Bilateral Abdomen)     Patient location during evaluation: PACU Anesthesia Type: General Level of consciousness: awake and alert Pain management: pain level controlled Vital Signs Assessment: post-procedure vital signs reviewed and stable Respiratory status: spontaneous breathing, nonlabored ventilation, respiratory function stable and patient connected to nasal cannula oxygen Cardiovascular status: blood pressure returned to baseline and stable Postop Assessment: no apparent nausea or vomiting Anesthetic complications: no    Last Vitals:  Vitals:   10/13/19 1615 10/13/19 1630  BP: (!) 146/90 (!) 149/88  Pulse: 87 86  Resp: 16 16  Temp:    SpO2: 94% 95%    Last Pain:  Vitals:   10/13/19 1630  TempSrc:   PainSc: 8                  Malayja Freund L Aja Bolander

## 2019-10-13 NOTE — H&P (Signed)
Marily Memos DOB: [birth year] Single / Language: Cleophus Molt / Race: White Male   Patient Care Team: Patient, No Pcp Per as PCP - General ([place]) Michael Boston, MD as Consulting Physician (General Surgery) Juanita Craver, MD as Consulting Physician (Gastroenterology) Irine Seal, MD as Attending Physician (Urology)  ` ` Patient sent for surgical consultation at the request of Dr Irine Seal, [facility]  Chief Complaint: Right inguinal hernia. ` ` The patient is a pleasant male. Set issues with insurance and usually does not see doctors. However is now aged into to Medicare and is trying to establish with primary and specialty care. He saw urology for an elevated PSA. He used to see Dr. Serita Butcher who has since retired. Switch to Dr. Jeffie Pollock. Noted to have right inguinal hernia. Surgical consultation recommended. Patient had a left inguinal hernia repaired in an open fashion by Dr. Barry Dienes with our group in 2010. He has a distant history of some intermittent abdominal pain concerning for possible diverticulitis although I do not know if it's ever been proven or documented. Certainly not for several years.  Noted that he's had bulging there for several months. He thinks it's gradually gotten larger. Walk a half hour without difficulty. 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 Tries to avoid heavy lifting. He does smoke but has cut back to about 5 cigarettes a day. Usually moves his bowels about twice a day. Her heart attack. No issues with diabetes. No major problems with urination or defecation.  (Review of systems as stated in this history (HPI) or in the review of systems. Otherwise all other 12 point ROS are negative) ` ` `   Past Surgical History (Tanisha A. Owens Shark, [place]; 08/30/2019 10:24 AM) Colon Polyp Removal - Colonoscopy Laparoscopic Inguinal Hernia Surgery Left.  Diagnostic Studies History (Tanisha A. Owens Shark, [place]; 08/30/2019 10:24  AM) Colonoscopy >10 years ago  Allergies (Tanisha A. Owens Shark, [place]; 08/30/2019 10:24 AM) No Known Drug Allergies [08/30/2019]: Allergies Reconciled  Medication History (Tanisha A. Owens Shark, [place]; 08/30/2019 10:25 AM) No Current Medications Medications Reconciled  Social History (Tanisha A. Owens Shark, [place]; 08/30/2019 10:24 AM) Alcohol use Occasional alcohol use. Caffeine use Coffee. Illicit drug use Remotely quit drug use. Tobacco use Current some day smoker.  Other Problems (Tanisha A. Owens Shark, [place]; 08/30/2019 10:24 AM) Diverticulosis Inguinal Hernia     Review of Systems (Tanisha A. Brown RMA; 08/30/2019 10:24 AM) General Not Present- Appetite Loss, Chills, Fatigue, Fever, Night Sweats, Weight Gain and Weight Loss. Skin Not Present- Change in Wart/Mole, Dryness, Hives, Jaundice, New Lesions, Non-Healing Wounds, Rash and Ulcer. HEENT Not Present- Earache, Hearing Loss, Hoarseness, Nose Bleed, Oral Ulcers, Ringing in the Ears, Seasonal Allergies, Sinus Pain, Sore Throat, Visual Disturbances, Wears glasses/contact lenses and Yellow Eyes. Respiratory Not Present- Bloody sputum, Chronic Cough, Difficulty Breathing, Snoring and Wheezing. Breast Not Present- Breast Mass, Breast Pain, Nipple Discharge and Skin Changes. Cardiovascular Not Present- Chest Pain, Difficulty Breathing Lying Down, Leg Cramps, Palpitations, Rapid Heart Rate, Shortness of Breath and Swelling of Extremities. Gastrointestinal Not Present- Abdominal Pain, Bloating, Bloody Stool, Change in Bowel Habits, Chronic diarrhea, Constipation, Difficulty Swallowing, Excessive gas, Gets full quickly at meals, Hemorrhoids, Indigestion, Nausea, Rectal Pain and Vomiting. Male Genitourinary Not Present- Blood in Urine, Change in Urinary Stream, Frequency, Impotence, Nocturia, Painful Urination, Urgency and Urine Leakage. Musculoskeletal Not Present- Back Pain, Joint Pain, Joint Stiffness, Muscle Pain, Muscle Weakness and Swelling of  Extremities. Neurological Not Present- Decreased Memory, Fainting, Headaches, Numbness, Seizures, Tingling, Tremor, Trouble  walking and Weakness. Psychiatric Not Present- Anxiety, Bipolar, Change in Sleep Pattern, Depression, Fearful and Frequent crying. Hematology Not Present- Blood Thinners, Easy Bruising, Excessive bleeding, Gland problems, HIV and Persistent Infections.  Vitals (Tanisha A. Brown RMA; 08/30/2019 10:25 AM) 08/30/2019 10:25 AM Weight: 151 lb Height: 68in Body Surface Area: 1.81 m Body Mass Index: 22.96 kg/m  Temp.: 97.62F  Pulse: 101 (Regular)  BP: 126/78 (Sitting, Left Arm, Standard)   10/13/2019 BP (!) 131/94   Pulse (!) 109   Temp 97.7 F (36.5 C) (Oral)   Resp 18   Ht 5\' 9"  (1.753 m)   Wt 69.9 kg   SpO2 97%   BMI 22.77 kg/m       Physical Exam Adin Hector MD; 08/30/2019 10:44 AM)  General Mental Status-Alert. General Appearance-Not in acute distress, Not Sickly. Orientation-Oriented X3. Hydration-Well hydrated. Voice-Normal. Note: Pleasant, chatty  Integumentary Global Assessment Upon inspection and palpation of skin surfaces of the - Axillae: non-tender, no inflammation or ulceration, no drainage. and Distribution of scalp and body hair is normal. General Characteristics Temperature - normal warmth is noted.  Head and Neck Head-normocephalic, atraumatic with no lesions or palpable masses. Face Global Assessment - atraumatic, no absence of expression. Neck Global Assessment - no abnormal movements, no bruit auscultated on the right, no bruit auscultated on the left, no decreased range of motion, non-tender. Trachea-midline. Thyroid Gland Characteristics - non-tender.  Eye Eyeball - Left-Extraocular movements intact, No Nystagmus - Left. Eyeball - Right-Extraocular movements intact, No Nystagmus - Right. Cornea - Left-No Hazy - Left. Cornea - Right-No Hazy - Right. Sclera/Conjunctiva -  Left-No scleral icterus, No Discharge - Left. Sclera/Conjunctiva - Right-No scleral icterus, No Discharge - Right. Pupil - Left-Direct reaction to light normal. Pupil - Right-Direct reaction to light normal. Note: Wears glasses. Vision corrected  ENMT Ears Pinna - Left - no drainage observed, no generalized tenderness observed. Pinna - Right - no drainage observed, no generalized tenderness observed. Nose and Sinuses External Inspection of the Nose - no destructive lesion observed. Inspection of the nares - Left - quiet respiration. Inspection of the nares - Right - quiet respiration. Mouth and Throat Lips - Upper Lip - no fissures observed, no pallor noted. Lower Lip - no fissures observed, no pallor noted. Nasopharynx - no discharge present. Oral Cavity/Oropharynx - Tongue - no dryness observed. Oral Mucosa - no cyanosis observed. Hypopharynx - no evidence of airway distress observed.  Chest and Lung Exam Inspection Movements - Normal and Symmetrical. Accessory muscles - No use of accessory muscles in breathing. Palpation Palpation of the chest reveals - Non-tender. Auscultation Breath sounds - Normal and Clear.  Cardiovascular Auscultation Rhythm - Regular. Murmurs & Other Heart Sounds - Auscultation of the heart reveals - No Murmurs and No Systolic Clicks.  Abdomen Inspection Inspection of the abdomen reveals - No Visible peristalsis and No Abnormal pulsations. Umbilicus - No Bleeding, No Urine drainage. Palpation/Percussion Palpation and Percussion of the abdomen reveal - Soft, Non Tender, No Rebound tenderness, No Rigidity (guarding) and No Cutaneous hyperesthesia. Note: Abdomen soft. Nontender. Not distended. No umbilical or incisional hernias. No guarding.  Male Genitourinary Sexual Maturity Tanner 5 - Adult hair pattern and Adult penile size and shape. Note: Obvious right groin bulging going down into upper scrotum consistent with reducible inguinal  hernia. Mild laxity and left external ring but no definite recurrent hernia.  Otherwise normal external male genitalia without any testicular masses.  Peripheral Vascular Upper Extremity Inspection - Left - No  Cyanotic nailbeds - Left, Not Ischemic. Inspection - Right - No Cyanotic nailbeds - Right, Not Ischemic.  Neurologic Neurologic evaluation reveals -normal attention span and ability to concentrate, able to name objects and repeat phrases. Appropriate fund of knowledge , normal sensation and normal coordination. Mental Status Affect - not angry, not paranoid. Cranial Nerves-Normal Bilaterally. Gait-Normal.  Neuropsychiatric Mental status exam performed with findings of-able to articulate well with normal speech/language, rate, volume and coherence, thought content normal with ability to perform basic computations and apply abstract reasoning and no evidence of hallucinations, delusions, obsessions or homicidal/suicidal ideation.  Musculoskeletal Global Assessment Spine, Ribs and Pelvis - no instability, subluxation or laxity. Right Upper Extremity - no instability, subluxation or laxity. Note: Fixed flexion of the right pinky finger  Lymphatic Head & Neck  General Head & Neck Lymphatics: Bilateral - Description - No Localized lymphadenopathy. Axillary  General Axillary Region: Bilateral - Description - No Localized lymphadenopathy. Femoral & Inguinal  Generalized Femoral & Inguinal Lymphatics: Left - Description - No Localized lymphadenopathy. Right - Description - No Localized lymphadenopathy.    Assessment & Plan   RIGHT INGUINAL HERNIA (K40.90) Impression: Obvious right inguinal hernia patient with a prior left inguinal hernia repair.  I think he would benefit from surgery. Reasonable laparoscopic preperitoneal approach. Rule out recurrence on the left side since there is some laxity is there as well. He is interested in  proceeding.     The anatomy & physiology of the abdominal wall and pelvic floor was discussed. The pathophysiology of hernias in the inguinal and pelvic region was discussed. Natural history risks such as progressive enlargement, pain, incarceration, and strangulation was discussed. Contributors to complications such as smoking, obesity, diabetes, prior surgery, etc were discussed.  I feel the risks of no intervention will lead to serious problems that outweigh the operative risks; therefore, I recommended surgery to reduce and repair the hernia. I explained laparoscopic techniques with possible need for an open approach. I noted usual use of mesh to patch and/or buttress hernia repair  Risks such as bleeding, infection, abscess, need for further treatment, heart attack, death, and other risks were discussed. I noted a good likelihood this will help address the problem. Goals of post-operative recovery were discussed as well. Possibility that this will not correct all symptoms was explained. I stressed the importance of low-impact activity, aggressive pain control, avoiding constipation, & not pushing through pain to minimize risk of post-operative chronic pain or injury. Possibility of reherniation was discussed. We will work to minimize complications.  An educational handout further explaining the pathology & treatment options was given as well. Questions were answered. The patient expresses understanding & wishes to proceed with surgery.    TOBACCO ABUSE (Z72.0) Impression: STOP SMOKING!  We talked to the patient about the dangers of smoking. We stressed that tobacco use dramatically increases the risk of peri-operative complications such as infection, tissue necrosis leaving to problems with incision/wound and organ healing, hernia, chronic pain, heart attack, stroke, DVT, pulmonary embolism, and death. We noted there are programs in our community to help stop smoking.  Information was available.  Current Plans Pt Education - [facility] STOP SMOKING!

## 2019-10-13 NOTE — Anesthesia Preprocedure Evaluation (Addendum)
Anesthesia Evaluation  Patient identified by MRN, date of birth, ID band Patient awake    Reviewed: Allergy & Precautions, NPO status , Patient's Chart, lab work & pertinent test results  Airway Mallampati: II  TM Distance: >3 FB Neck ROM: Full    Dental no notable dental hx.    Pulmonary neg pulmonary ROS, former smoker,    Pulmonary exam normal breath sounds clear to auscultation       Cardiovascular negative cardio ROS Normal cardiovascular exam Rhythm:Regular Rate:Normal     Neuro/Psych negative neurological ROS  negative psych ROS   GI/Hepatic negative GI ROS, Neg liver ROS,   Endo/Other  negative endocrine ROS  Renal/GU negative Renal ROS  negative genitourinary   Musculoskeletal negative musculoskeletal ROS (+)   Abdominal   Peds  Hematology negative hematology ROS (+)   Anesthesia Other Findings   Reproductive/Obstetrics                          Anesthesia Physical Anesthesia Plan  ASA: I  Anesthesia Plan: General   Post-op Pain Management:    Induction: Intravenous  PONV Risk Score and Plan: 4 or greater and Ondansetron, Dexamethasone, Midazolam and Treatment may vary due to age or medical condition  Airway Management Planned: Oral ETT  Additional Equipment: None  Intra-op Plan:   Post-operative Plan: Extubation in OR  Informed Consent: I have reviewed the patients History and Physical, chart, labs and discussed the procedure including the risks, benefits and alternatives for the proposed anesthesia with the patient or authorized representative who has indicated his/her understanding and acceptance.     Dental advisory given  Plan Discussed with: CRNA  Anesthesia Plan Comments:         Anesthesia Quick Evaluation

## 2019-10-13 NOTE — Op Note (Signed)
10/13/2019  3:32 PM  PATIENT:  Anthony Duke  64 y.o. male  Patient Care Team: Patient, No Pcp Per as PCP - General (General Practice) Michael Boston, MD as Consulting Physician (General Surgery) Juanita Craver, MD as Consulting Physician (Gastroenterology) Irine Seal, MD as Attending Physician (Urology)  PRE-OPERATIVE DIAGNOSIS:  RIGHT AND POSSIBLE LEFT INGUINAL HERNIAS  POST-OPERATIVE DIAGNOSIS:  RIGHT AND POSSIBLE LEFT INGUINAL HERNIAS  PROCEDURE:  LAPAROSCOPIC RIGHT INGUINAL HERNIA  REPAIR  WITH MESH LAPAROSCOPIC BILATERAL FEMORAL HERNIA  REPAIRS  WITH MESH TAP BLOCK - BILATERAL  SURGEON:  Adin Hector, MD  ASSISTANT: None  ANESTHESIA:     Regional ilioinguinal and genitofemoral and spermatic cord nerve blocks  General  Nerve block provided with liposomal bupivacaine (Experel) mixed with 0.25% bupivacaine as a Bilateral TAP block x 55mL each side at the level of the transverse abdominis & preperitoneal spaces along the flank at the anterior axillary line, from subcostal ridge to iliac crest under laparoscopic guidance    EBL:  Total I/O In: 100 [IV Piggyback:100] Out: - .  See anesthesia record  Delay start of Pharmacological VTE agent (>24hrs) due to surgical blood loss or risk of bleeding:  no  DRAINS: NONE  SPECIMEN:  (Spematic cord lipomas not sent) NONE  DISPOSITION OF SPECIMEN:  N/A  COUNTS:  YES  PLAN OF CARE: Discharge to home after PACU  PATIENT DISPOSITION:  PACU - hemodynamically stable.  INDICATION: Patient with obvious right inguinal hernia.  History of prior left inguinal hernia repair.  I recommended laparoscopic exploration and repair of hernias found  The anatomy & physiology of the abdominal wall and pelvic floor was discussed.  The pathophysiology of hernias in the inguinal and pelvic region was discussed.  Natural history risks such as progressive enlargement, pain, incarceration & strangulation was discussed.   Contributors to  complications such as smoking, obesity, diabetes, prior surgery, etc were discussed.    I feel the risks of no intervention will lead to serious problems that outweigh the operative risks; therefore, I recommended surgery to reduce and repair the hernia.  I explained laparoscopic techniques with possible need for an open approach.  I noted usual use of mesh to patch and/or buttress hernia repair  Risks such as bleeding, infection, abscess, need for further treatment, heart attack, death, and other risks were discussed.  I noted a good likelihood this will help address the problem.   Goals of post-operative recovery were discussed as well.  Possibility that this will not correct all symptoms was explained.  I stressed the importance of low-impact activity, aggressive pain control, avoiding constipation, & not pushing through pain to minimize risk of post-operative chronic pain or injury. Possibility of reherniation was discussed.  We will work to minimize complications.     An educational handout further explaining the pathology & treatment options was given as well.  Questions were answered.  The patient expresses understanding & wishes to proceed with surgery.  OR FINDINGS: Direct greater than indirect pantaloon type inguinal hernia on the right side.  Subtle but definite femoral hernia.  No obturator hernia.  On the left side no evidence of any recurrent inguinal hernia.  However, definite femoral hernia.  No obvious obturator hernia.  DESCRIPTION:  The patient was identified & brought into the operating room. The patient was positioned supine with arms tucked. SCDs were active during the entire case. The patient underwent general anesthesia without any difficulty.  The abdomen was prepped and draped in a  sterile fashion. The patient's bladder was emptied.  A Surgical Timeout confirmed our plan.  I made a transverse incision through the inferior umbilical fold.  I made a small transverse nick  through the anterior rectus fascia contralateral to the inguinal hernia side and placed a 0-vicryl stitch through the fascia.  I placed a Hasson trocar into the preperitoneal plane.  Entry was clean.  We induced carbon dioxide insufflation. Camera inspection revealed no injury.  I used a 64mm angled scope to bluntly free the peritoneum off the infraumbilical anterior abdominal wall.  I created enough of a preperitoneal pocket to place 47mm ports into the right & left mid-abdomen into this preperitoneal cavity.  I focused attention on the RIGHT pelvis since that was the dominant hernia side.   I used blunt & focused sharp dissection to free the peritoneum off the flank and down to the pubic rim.  I freed the anteriolateral bladder wall off the anteriolateral pelvic wall, sparing midline attachments.   I located a swath of peritoneum going into a hernia fascial defect at the  direct space consistent with  a direct space inguinal hernia.  Also a hernia at the internal ring consistent with an indirect inguinal hernia.  Therefore a pantaloon type hernia.  I gradually freed the peritoneal hernia sac off safely and reduced it into the preperitoneal space.  I freed the peritoneum off the spermatic vessels & vas deferens.  Small but definite femoral hernia as well.  I freed peritoneum off the retroperitoneum along the psoas muscle.  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  I checked & assured hemostasis.     I turned attention on the opposite LEFT pelvis.  I can detect a torturous external iliac vessel going into an obvious femoral hernia.  No obvious indirect inguinal hernia or recurrent direct hernia on the left side.  No femoral hernia.  I did dissection in a similar, mirror-image fashion. The patient had a femoral hernia.  I checked & assured hemostasis.  I ended up having to do a peritoneal repair in the right paramedian region with 2-0 Vicryl using laparoscopic intracorporeal suturing to  good result.  I chose 15x15 cm sheets of ultra-lightweight polypropylene mesh (Ultrapro), one for each side.  I cut a single sigmoid-shaped slit ~6cm from a corner of each mesh.  I placed the meshes into the preperitoneal space & laid them as overlapping diamonds such that at the inferior points, a 6x6 cm corner flap rested in the true anterolateral pelvis, covering the obturator & femoral foramina.   I allowed the bladder to return to the pubis, this helping tuck the corners of the mesh in the anteriolateral pelvis.  The medial corners overlapped each other across midline cephalad to the pubic rim.   Given the numerous hernias of moderate size, I placed a third 15x15cm mesh in the center as a vertical diamond.  The lateral wings of the mesh overlap across the direct spaces and internal rings where the dominant hernias were.  This provided good coverage and reinforcement of the hernia repairs.  Because of the central mesh placement with good overlap, I did not place any tacks.   I held the hernia sacs cephalad & evacuated carbon dioxide.  I closed the fascia with absorbable suture.  I closed the skin using 4-0 monocryl stitch.  Sterile dressings were applied.   The patient was extubated & arrived in the PACU in stable condition..  I had discussed postoperative care  with the patient in the holding area.  Instructions are written in the chart.  I discussed operative findings, updated the patient's status, discussed probable steps to recovery, and gave postoperative recommendations to the patient's significant other, Salome Holmes.  Recommendations were made.  Questions were answered.  She expressed understanding & appreciation.   Adin Hector, M.D., F.A.C.S. Gastrointestinal and Minimally Invasive Surgery [facility] [address], [place], [place] [id number] [phone] Main / Paging  10/13/2019 3:32 PM

## 2019-10-13 NOTE — Interval H&P Note (Signed)
History and Physical Interval Note:  10/13/2019 1:03 PM  Anthony Duke  has presented today for surgery, with the diagnosis of RIGHT AND POSSIBLE LEFT INGUINAL HERNIAS.  The various methods of treatment have been discussed with the patient and family. After consideration of risks, benefits and other options for treatment, the patient has consented to  Procedure(s): LAPAROSCOPIC RIGHT AND POSSIBLE LEFT INGUINAL HERNIA REPAIR WITH MESH (Bilateral) as a surgical intervention.  The patient's history has been reviewed, patient examined, no change in status, stable for surgery.  I have reviewed the patient's chart and labs.  Questions were answered to the patient's satisfaction.    I have re-reviewed the the patient's records, history, medications, and allergies.  I have re-examined the patient.  I again discussed intraoperative plans and goals of post-operative recovery.  The patient agrees to proceed.  Anthony Duke  21-Jan-1956 [id number]  Patient Care Team: Patient, No Pcp Per as PCP - General (General Practice) Michael Boston, MD as Consulting Physician (General Surgery) Juanita Craver, MD as Consulting Physician (Gastroenterology) Irine Seal, MD as Attending Physician (Urology)  Patient Active Problem List   Diagnosis Date Noted   Right inguinal hernia 08/30/2019    Past Medical History:  Diagnosis Date   History of diverticulitis of colon    10-06-2019  per pt treated with medication ,  approx. 2006   Wears glasses     Past Surgical History:  Procedure Laterality Date   LAPAROSCOPIC INGUINAL HERNIA REPAIR Left 08/ 2010  Dr Barry Dienes    Social History   Socioeconomic History   Marital status: Single    Spouse name: Not on file   Number of children: Not on file   Years of education: Not on file   Highest education level: Not on file  Occupational History   Not on file  Tobacco Use   Smoking status: Former Smoker    Packs/day: 0.50    Years: 10.00    Pack years: 5.00     Types: Cigarettes    Quit date: 09/08/2019    Years since quitting: 0.0   Smokeless tobacco: Never Used   Tobacco comment: 10-06-2019  per pt ready to quit , so stopped smoking 09-08-2019  Substance and Sexual Activity   Alcohol use: Not on file   Drug use: Never   Sexual activity: Not on file  Other Topics Concern   Not on file  Social History Narrative   Not on file   Social Determinants of Health   Financial Resource Strain:    Difficulty of Paying Living Expenses: Not on file  Food Insecurity:    Worried About [place] in the Last Year: Not on file   Ran Out of Food in the Last Year: Not on file  Transportation Needs:    Lack of Transportation (Medical): Not on file   Lack of Transportation (Non-Medical): Not on file  Physical Activity:    Days of Exercise per Week: Not on file   Minutes of Exercise per Session: Not on file  Stress:    Feeling of Stress : Not on file  Social Connections:    Frequency of Communication with Friends and Family: Not on file   Frequency of Social Gatherings with Friends and Family: Not on file   Attends Religious Services: Not on file   Active Member of Clubs or Organizations: Not on file   Attends Archivist Meetings: Not on file   Marital Status: Not on file  Intimate Partner Violence:    Fear of Current or Ex-Partner: Not on file   Emotionally Abused: Not on file   Physically Abused: Not on file   Sexually Abused: Not on file    History reviewed. No pertinent family history.  No medications prior to admission.    Current Facility-Administered Medications  Medication Dose Route Frequency Provider Last Rate Last Admin   bupivacaine liposome (EXPAREL) 1.3 % injection 266 mg  20 mL Infiltration On Call to OR Michael Boston, MD       ceFAZolin (ANCEF) IVPB 2g/100 mL premix  2 g Intravenous On Call to OR Michael Boston, MD       Chlorhexidine Gluconate Cloth 2 % PADS 6 each  6 each Topical Once Michael Boston, MD        And   Chlorhexidine Gluconate Cloth 2 % PADS 6 each  6 each Topical Once Michael Boston, MD       lactated ringers infusion   Intravenous Continuous Duane Boston, MD 50 mL/hr at 10/13/19 1200 50 mL/hr at 10/13/19 1200     No Known Allergies  BP (!) 131/94   Pulse (!) 109   Temp 97.7 F (36.5 C) (Oral)   Resp 18   Ht 5\' 9"  (1.753 m)   Wt 69.9 kg   SpO2 97%   BMI 22.77 kg/m   Labs: No results found for this or any previous visit (from the past 48 hour(s)).  Imaging / Studies: No results found.   Adin Hector, M.D., F.A.C.S. Gastrointestinal and Minimally Invasive Surgery [facility] [address] [place],  [address] [phone] Main / Paging  10/13/2019 1:03 PM    Adin Hector

## 2019-10-13 NOTE — Anesthesia Procedure Notes (Signed)
Procedure Name: Intubation Date/Time: 10/13/2019 1:30 PM Performed by: Glory Buff, CRNA Pre-anesthesia Checklist: Patient identified, Emergency Drugs available, Suction available and Patient being monitored Patient Re-evaluated:Patient Re-evaluated prior to induction Oxygen Delivery Method: Circle system utilized Preoxygenation: Pre-oxygenation with 100% oxygen Induction Type: IV induction Ventilation: Mask ventilation without difficulty Laryngoscope Size: Miller and 3 Grade View: Grade I Tube type: Oral Tube size: 7.5 mm Number of attempts: 1 Airway Equipment and Method: Stylet and Oral airway Placement Confirmation: ETT inserted through vocal cords under direct vision,  positive ETCO2 and breath sounds checked- equal and bilateral Secured at: 22 cm Tube secured with: Tape Dental Injury: Teeth and Oropharynx as per pre-operative assessment

## 2019-10-13 NOTE — Discharge Instructions (Signed)
HERNIA REPAIR: POST OP INSTRUCTIONS  ######################################################################  EAT Gradually transition to a high fiber diet with a fiber supplement over the next few weeks after discharge.  Start with a pureed / full liquid diet (see below)  WALK Walk an hour a day.  Control your pain to do that.    CONTROL PAIN Control pain so that you can walk, sleep, tolerate sneezing/coughing, and go up/down stairs.  HAVE A BOWEL MOVEMENT DAILY Keep your bowels regular to avoid problems.  OK to try a laxative to override constipation.  OK to use an antidairrheal to slow down diarrhea.  Call if not better after 2 tries  CALL IF YOU HAVE PROBLEMS/CONCERNS Call if you are still struggling despite following these instructions. Call if you have concerns not answered by these instructions  ######################################################################    1. DIET: Follow a light bland diet & liquids the first 24 hours after arrival home, such as soup, liquids, starches, etc.  Be sure to drink plenty of fluids.  Quickly advance to a usual solid diet within a few days.  Avoid fast food or heavy meals as your are more likely to get nauseated or have irregular bowels.  A low-fat, high-fiber diet for the rest of your life is ideal.   2. Take your usually prescribed home medications unless otherwise directed.  3. PAIN CONTROL: a. Pain is best controlled by a usual combination of three different methods TOGETHER: i. Ice/Heat ii. Over the counter pain medication iii. Prescription pain medication b. Most patients will experience some swelling and bruising around the hernia(s) such as the bellybutton, groins, or old incisions.  Ice packs or heating pads (30-60 minutes up to 6 times a day) will help. Use ice for the first few days to help decrease swelling and bruising, then switch to heat to help relax tight/sore spots and speed recovery.  Some people prefer to use ice  alone, heat alone, alternating between ice & heat.  Experiment to what works for you.  Swelling and bruising can take several weeks to resolve.   c. It is helpful to take an over-the-counter pain medication regularly for the first few weeks.  Choose one of the following that works best for you: i. Naproxen (Aleve, etc)  Two 244m tabs twice a day ii. Ibuprofen (Advil, etc) Three 2036mtabs four times a day (every meal & bedtime) iii. Acetaminophen (Tylenol, etc) 325-65016mour times a day (every meal & bedtime) d. A  prescription for pain medication should be given to you upon discharge.  Take your pain medication as prescribed.  i. If you are having problems/concerns with the prescription medicine (does not control pain, nausea, vomiting, rash, itching, etc), please call us [phone] see if we need to switch you to a different pain medicine that will work better for you and/or control your side effect better. ii. If you need a refill on your pain medication, please contact your pharmacy.  They will contact our office to request authorization. Prescriptions will not be filled after 5 pm or on week-ends.  4. Avoid getting constipated.  Between the surgery and the pain medications, it is common to experience some constipation.  Increasing fluid intake and taking a fiber supplement (such as Metamucil, Citrucel, FiberCon, MiraLax, etc) 1-2 times a day regularly will usually help prevent this problem from occurring.  A mild laxative (prune juice, Milk of Magnesia, MiraLax, etc) should be taken according to package directions if there are no bowel movements after 48  hours.    5. Wash / shower every day.  You may shower over the dressings as they are waterproof.    6. Remove your waterproof bandages, skin tapes, and other bandages 5 days after surgery. You may replace a dressing/Band-Aid to cover the incision for comfort if you wish. You may leave the incisions open to air.  You may replace a  dressing/Band-Aid to cover an incision for comfort if you wish.  Continue to shower over incision(s) after the dressing is off.  7. ACTIVITIES as tolerated:   a. You may resume regular (light) daily activities beginning the next day--such as daily self-care, walking, climbing stairs--gradually increasing activities as tolerated.  Control your pain so that you can walk an hour a day.  If you can walk 30 minutes without difficulty, it is safe to try more intense activity such as jogging, treadmill, bicycling, low-impact aerobics, swimming, etc. b. Save the most intensive and strenuous activity for last such as sit-ups, heavy lifting, contact sports, etc  Refrain from any heavy lifting or straining until you are off narcotics for pain control.   c. DO NOT PUSH THROUGH PAIN.  Let pain be your guide: If it hurts to do something, don't do it.  Pain is your body warning you to avoid that activity for another week until the pain goes down. d. You may drive when you are no longer taking prescription pain medication, you can comfortably wear a seatbelt, and you can safely maneuver your car and apply brakes. e. Dennis Bast may have sexual intercourse when it is comfortable.   8. FOLLOW UP in our office a. Please call [facility] at [phone] to set up an appointment to see your surgeon in the office for a follow-up appointment approximately 2-3 weeks after your surgery. b. Make sure that you call for this appointment the day you arrive home to insure a convenient appointment time.  9.  If you have disability of FMLA / Family leave forms, please bring the forms to the office for processing.  (do not give to your surgeon).  WHEN TO CALL us [phone]: 1. Poor pain control 2. Reactions / problems with new medications (rash/itching, nausea, etc)  3. Fever over 101.5 F (38.5 C) 4. Inability to urinate 5. Nausea and/or vomiting 6. Worsening swelling or bruising 7. Continued bleeding from incision. 8. Increased pain,  redness, or drainage from the incision   The clinic staff is available to answer your questions during regular business hours (8:30am-5pm).  Please don't hesitate to call and ask to speak to one of our nurses for clinical concerns.   If you have a medical emergency, go to the nearest emergency room or call 911.  A surgeon from [facility] is always on call at the hospitals in [facility], [place], [place], [place], [place]  [address] ?  [address] [address], [place], [place]   [address] MAIN: [phone] ? TOLL FREE: [phone] ? FAX: [phone] [email or web address]    Inguinal Hernia, Adult An inguinal hernia develops when fat or the intestines push through a weak spot in a muscle where your leg meets your lower abdomen (groin). This creates a bulge. This kind of hernia could also be:  In your scrotum, if you are male.  In folds of skin around your vagina, if you are male. There are three types of inguinal hernias:  Hernias that can be pushed back into the abdomen (are reducible). This type  rarely causes pain.  Hernias that are not reducible (are incarcerated).  Hernias that are not reducible and lose their blood supply (are strangulated). This type of hernia requires emergency surgery. What are the causes? This condition is caused by having a weak spot in the muscles or tissues in the groin. This weak spot develops over time. The hernia may poke through the weak spot when you suddenly strain your lower abdominal muscles, such as when you:  Lift a heavy object.  Strain to have a bowel movement. Constipation can lead to straining.  Cough. What increases the risk? This condition is more likely to develop in:  Men.  Pregnant women.  People who: ? Are overweight. ? Work in jobs that require long periods of standing or heavy lifting. ? Have had an inguinal hernia before. ? Smoke or have lung disease. These  factors can lead to long-lasting (chronic) coughing. What are the signs or symptoms? Symptoms may depend on the size of the hernia. Often, a small inguinal hernia has no symptoms. Symptoms of a larger hernia may include:  A lump in the groin area. This is easier to see when standing. It might not be visible when lying down.  Pain or burning in the groin. This may get worse when lifting, straining, or coughing.  A dull ache or a feeling of pressure in the groin.  In men, an unusual lump in the scrotum. Symptoms of a strangulated inguinal hernia may include:  A bulge in your groin that is very painful and tender to the touch.  A bulge that turns red or purple.  Fever, nausea, and vomiting.  Inability to have a bowel movement or to pass gas. How is this diagnosed? This condition is diagnosed based on your symptoms, your medical history, and a physical exam. Your health care provider may feel your groin area and ask you to cough. How is this treated? Treatment depends on the size of your hernia and whether you have symptoms. If you do not have symptoms, your health care provider may have you watch your hernia carefully and have you come in for follow-up visits. If your hernia is large or if you have symptoms, you may need surgery to repair the hernia. Follow these instructions at home: Lifestyle  Avoid lifting heavy objects.  Avoid standing for long periods of time.  Do not use any products that contain nicotine or tobacco, such as cigarettes and e-cigarettes. If you need help quitting, ask your health care provider.  Maintain a healthy weight. Preventing constipation  Take actions to prevent constipation. Constipation leads to straining with bowel movements, which can make a hernia worse or cause a hernia repair to break down. Your health care provider may recommend that you: ? Drink enough fluid to keep your urine pale yellow. ? Eat foods that are high in fiber, such as fresh  fruits and vegetables, whole grains, and beans. ? Limit foods that are high in fat and processed sugars, such as fried or sweet foods. ? Take an over-the-counter or prescription medicine for constipation. General instructions  You may try to push the hernia back in place by very gently pressing on it while lying down. Do not try to force the bulge back in if it will not push in easily.  Watch your hernia for any changes in shape, size, or color. Get help right away if you notice any changes.  Take over-the-counter and prescription medicines only as told by your health care provider.  Keep all follow-up visits as told by your health care provider. This is important. Contact a health care provider if:  You have a fever.  You develop new symptoms.  Your symptoms get worse. Get help right away if:  You have pain in your groin that suddenly gets worse.  You have a bulge in your groin that: ? Suddenly gets bigger and does not get smaller. ? Becomes red or purple or painful to the touch.  You are a man and you have a sudden pain in your scrotum, or the size of your scrotum suddenly changes.  You cannot push the hernia back in place by very gently pressing on it when you are lying down. Do not try to force the bulge back in if it will not push in easily.  You have nausea or vomiting that does not go away.  You have a fast heartbeat.  You cannot have a bowel movement or pass gas. These symptoms may represent a serious problem that is an emergency. Do not wait to see if the symptoms will go away. Get medical help right away. Call your local emergency services (911 in the [place] Summary  An inguinal hernia develops when fat or the intestines push through a weak spot in a muscle where your leg meets your lower abdomen (groin).  This condition is caused by having a weak spot in muscles or tissue in your groin.  Symptoms may depend on the size of the hernia, and they may include pain or  swelling in your groin. A small inguinal hernia often has no symptoms.  Treatment may not be needed if you do not have symptoms. If you have symptoms or a large hernia, you may need surgery to repair the hernia.  Avoid lifting heavy objects. Also avoid standing for long amounts of time. This information is not intended to replace advice given to you by your health care provider. Make sure you discuss any questions you have with your health care provider. Document Revised: 09/06/2017 Document Reviewed: 05/07/2017 Elsevier Patient Education  [place] Instructions  Activity: Get plenty of rest for the remainder of the day. A responsible individual must stay with you for 24 hours following the procedure.  For the next 24 hours, DO NOT: -Drive a car -Paediatric nurse -Drink alcoholic beverages -Take any medication unless instructed by your physician -Make any legal decisions or sign important papers.  Meals: Start with liquid foods such as gelatin or soup. Progress to regular foods as tolerated. Avoid greasy, spicy, heavy foods. If nausea and/or vomiting occur, drink only clear liquids until the nausea and/or vomiting subsides. Call your physician if vomiting continues.  Special Instructions/Symptoms: Your throat may feel dry or sore from the anesthesia or the breathing tube placed in your throat during surgery. If this causes discomfort, gargle with warm salt water. The discomfort should disappear within 24 hours.  Information for Discharge Teaching: EXPAREL (bupivacaine liposome injectable suspension)   Your surgeon or anesthesiologist gave you EXPAREL(bupivacaine) to help control your pain after surgery.   EXPAREL is a local anesthetic that provides pain relief by numbing the tissue around the surgical site.  EXPAREL is designed to release pain medication over time and can control pain for up to 72 hours.  Depending on how you respond to  EXPAREL, you may require less pain medication during your recovery.  Possible side effects:  Temporary loss of sensation or ability to move in the area  where bupivacaine was injected.  Nausea, vomiting, constipation  Rarely, numbness and tingling in your mouth or lips, lightheadedness, or anxiety may occur.  Call your doctor right away if you think you may be experiencing any of these sensations, or if you have other questions regarding possible side effects.  Follow all other discharge instructions given to you by your surgeon or nurse. Eat a healthy diet and drink plenty of water or other fluids.  If you return to the hospital for any reason within 96 hours following the administration of EXPAREL, it is important for health care providers to know that you have received this anesthetic. A teal colored band has been placed on your arm with the date, time and amount of EXPAREL you have received in order to alert and inform your health care providers. Please leave this armband in place for the full 96 hours following administration, and then you may remove the band.  May remove green armband Sunday, October 17, 2019.

## 2020-05-17 ENCOUNTER — Other Ambulatory Visit: Payer: Self-pay | Admitting: Orthopedic Surgery

## 2020-05-30 ENCOUNTER — Other Ambulatory Visit: Payer: Self-pay

## 2020-05-30 ENCOUNTER — Encounter (HOSPITAL_BASED_OUTPATIENT_CLINIC_OR_DEPARTMENT_OTHER): Payer: Self-pay | Admitting: Orthopedic Surgery

## 2020-06-02 ENCOUNTER — Other Ambulatory Visit (HOSPITAL_COMMUNITY)
Admission: RE | Admit: 2020-06-02 | Discharge: 2020-06-02 | Disposition: A | Discharge status: Home / Self Care | Payer: Medicare HMO | Source: Ambulatory Visit | Attending: Orthopedic Surgery | Admitting: Orthopedic Surgery

## 2020-06-02 DIAGNOSIS — Z20822 Contact with and (suspected) exposure to covid-19: Secondary | ICD-10-CM | POA: Diagnosis not present

## 2020-06-02 DIAGNOSIS — Z01812 Encounter for preprocedural laboratory examination: Secondary | ICD-10-CM | POA: Insufficient documentation

## 2020-06-02 LAB — SARS CORONAVIRUS 2 (TAT 6-24 HRS): SARS Coronavirus 2: NEGATIVE

## 2020-06-02 NOTE — Progress Notes (Signed)

## 2020-06-06 ENCOUNTER — Ambulatory Visit (HOSPITAL_BASED_OUTPATIENT_CLINIC_OR_DEPARTMENT_OTHER)
Admission: RE | Admit: 2020-06-06 | Discharge: 2020-06-06 | Disposition: A | Discharge status: Home / Self Care | Payer: Medicare HMO | Attending: Orthopedic Surgery | Admitting: Orthopedic Surgery

## 2020-06-06 ENCOUNTER — Encounter (HOSPITAL_BASED_OUTPATIENT_CLINIC_OR_DEPARTMENT_OTHER)
Admission: RE | Disposition: A | Discharge status: Home / Self Care | Payer: Self-pay | Source: Home / Self Care | Attending: Orthopedic Surgery

## 2020-06-06 ENCOUNTER — Encounter (HOSPITAL_BASED_OUTPATIENT_CLINIC_OR_DEPARTMENT_OTHER): Payer: Self-pay | Admitting: Orthopedic Surgery

## 2020-06-06 ENCOUNTER — Ambulatory Visit (HOSPITAL_BASED_OUTPATIENT_CLINIC_OR_DEPARTMENT_OTHER): Payer: Medicare HMO | Admitting: Anesthesiology

## 2020-06-06 ENCOUNTER — Other Ambulatory Visit: Payer: Self-pay

## 2020-06-06 DIAGNOSIS — Z87891 Personal history of nicotine dependence: Secondary | ICD-10-CM | POA: Insufficient documentation

## 2020-06-06 DIAGNOSIS — M720 Palmar fascial fibromatosis [Dupuytren]: Secondary | ICD-10-CM | POA: Diagnosis present

## 2020-06-06 HISTORY — PX: DUPUYTREN CONTRACTURE RELEASE: SHX1478

## 2020-06-06 SURGERY — RELEASE, DUPUYTREN CONTRACTURE
Anesthesia: Monitor Anesthesia Care | Site: Arm Lower | Laterality: Right

## 2020-06-06 MED ORDER — SILVER SULFADIAZINE 1 % EX CREA
TOPICAL_CREAM | CUTANEOUS | Status: DC | PRN
  Administered 2020-06-06: 1 via TOPICAL

## 2020-06-06 MED ORDER — PROPOFOL 500 MG/50ML IV EMUL
INTRAVENOUS | Status: DC | PRN
  Administered 2020-06-06 (×4): 20 mg via INTRAVENOUS

## 2020-06-06 MED ORDER — TRAMADOL HCL 50 MG PO TABS: 50.0000 mg | ORAL_TABLET | Freq: Four times a day (QID) | ORAL | 0 refills | Status: AC | PRN

## 2020-06-06 MED ORDER — FENTANYL CITRATE (PF) 100 MCG/2ML IJ SOLN
INTRAMUSCULAR | Status: AC
  Filled 2020-06-06: qty 2

## 2020-06-06 MED ORDER — OXYCODONE HCL 5 MG/5ML PO SOLN: 5.0000 mg | Freq: Once | ORAL | Status: DC | PRN

## 2020-06-06 MED ORDER — FENTANYL CITRATE (PF) 100 MCG/2ML IJ SOLN
100.0000 ug | Freq: Once | INTRAMUSCULAR | Status: AC
  Administered 2020-06-06: 100 ug via INTRAVENOUS

## 2020-06-06 MED ORDER — LACTATED RINGERS IV SOLN: INTRAVENOUS | Status: DC

## 2020-06-06 MED ORDER — PROPOFOL 10 MG/ML IV BOLUS
INTRAVENOUS | Status: AC
  Filled 2020-06-06: qty 20

## 2020-06-06 MED ORDER — OXYCODONE HCL 5 MG PO TABS: 5.0000 mg | ORAL_TABLET | Freq: Once | ORAL | Status: DC | PRN

## 2020-06-06 MED ORDER — PROPOFOL 500 MG/50ML IV EMUL: INTRAVENOUS | Status: DC | PRN

## 2020-06-06 MED ORDER — FENTANYL CITRATE (PF) 100 MCG/2ML IJ SOLN: 25.0000 ug | INTRAMUSCULAR | Status: DC | PRN

## 2020-06-06 MED ORDER — MIDAZOLAM HCL 2 MG/2ML IJ SOLN
INTRAMUSCULAR | Status: AC
  Filled 2020-06-06: qty 2

## 2020-06-06 MED ORDER — MIDAZOLAM HCL 2 MG/2ML IJ SOLN
2.0000 mg | Freq: Once | INTRAMUSCULAR | Status: AC
  Administered 2020-06-06: 2 mg via INTRAVENOUS

## 2020-06-06 MED ORDER — THROMBIN 5000 UNITS EX SOLR
CUTANEOUS | Status: DC | PRN
  Administered 2020-06-06: 5000 [IU] via TOPICAL

## 2020-06-06 MED ORDER — CEFAZOLIN SODIUM-DEXTROSE 2-4 GM/100ML-% IV SOLN
2.0000 g | INTRAVENOUS | Status: AC
  Administered 2020-06-06: 2 g via INTRAVENOUS

## 2020-06-06 MED ORDER — ONDANSETRON HCL 4 MG/2ML IJ SOLN: 4.0000 mg | Freq: Once | INTRAMUSCULAR | Status: DC | PRN

## 2020-06-06 MED ORDER — PROPOFOL 500 MG/50ML IV EMUL
INTRAVENOUS | Status: DC | PRN
  Administered 2020-06-06: 75 ug/kg/min via INTRAVENOUS

## 2020-06-06 MED ORDER — ONDANSETRON HCL 4 MG/2ML IJ SOLN
INTRAMUSCULAR | Status: DC | PRN
  Administered 2020-06-06: 4 mg via INTRAVENOUS

## 2020-06-06 MED ORDER — CEFAZOLIN SODIUM-DEXTROSE 2-4 GM/100ML-% IV SOLN
INTRAVENOUS | Status: AC
  Filled 2020-06-06: qty 100

## 2020-06-06 SURGICAL SUPPLY — 44 items
APL PRP STRL LF DISP 70% ISPRP (MISCELLANEOUS) ×1
BLADE MINI RND TIP GREEN BEAV (BLADE) ×2 IMPLANT
BLADE SURG 15 STRL LF DISP TIS (BLADE) ×1 IMPLANT
BLADE SURG 15 STRL SS (BLADE) ×3
BNDG CMPR 9X4 STRL LF SNTH (GAUZE/BANDAGES/DRESSINGS) ×1
BNDG COHESIVE 3X5 TAN STRL LF (GAUZE/BANDAGES/DRESSINGS) ×3 IMPLANT
BNDG ESMARK 4X9 LF (GAUZE/BANDAGES/DRESSINGS) ×3 IMPLANT
BNDG GAUZE ELAST 4 BULKY (GAUZE/BANDAGES/DRESSINGS) ×3 IMPLANT
CHLORAPREP W/TINT 26 (MISCELLANEOUS) ×3 IMPLANT
CORD BIPOLAR FORCEPS 12FT (ELECTRODE) ×3 IMPLANT
COVER BACK TABLE 60X90IN (DRAPES) ×3 IMPLANT
COVER MAYO STAND STRL (DRAPES) ×3 IMPLANT
COVER WAND RF STERILE (DRAPES) IMPLANT
CUFF TOURN SGL QUICK 18X4 (TOURNIQUET CUFF) ×2 IMPLANT
DECANTER SPIKE VIAL GLASS SM (MISCELLANEOUS) IMPLANT
DRAPE EXTREMITY T 121X128X90 (DISPOSABLE) ×3 IMPLANT
DRAPE SURG 17X23 STRL (DRAPES) ×3 IMPLANT
GAUZE SPONGE 4X4 12PLY STRL (GAUZE/BANDAGES/DRESSINGS) ×3 IMPLANT
GAUZE XEROFORM 1X8 LF (GAUZE/BANDAGES/DRESSINGS) ×3 IMPLANT
GLOVE BIOGEL PI IND STRL 8.5 (GLOVE) ×1 IMPLANT
GLOVE BIOGEL PI INDICATOR 8.5 (GLOVE) ×2
GLOVE SURG ORTHO 8.0 STRL STRW (GLOVE) ×3 IMPLANT
GOWN STRL REUS W/ TWL LRG LVL3 (GOWN DISPOSABLE) ×1 IMPLANT
GOWN STRL REUS W/TWL LRG LVL3 (GOWN DISPOSABLE) ×3
GOWN STRL REUS W/TWL XL LVL3 (GOWN DISPOSABLE) ×5 IMPLANT
LOOP VESSEL MAXI BLUE (MISCELLANEOUS) ×3 IMPLANT
NS IRRIG 1000ML POUR BTL (IV SOLUTION) ×3 IMPLANT
PACK BASIN DAY SURGERY FS (CUSTOM PROCEDURE TRAY) ×3 IMPLANT
PAD CAST 3X4 CTTN HI CHSV (CAST SUPPLIES) ×1 IMPLANT
PADDING CAST ABS 3INX4YD NS (CAST SUPPLIES)
PADDING CAST ABS 4INX4YD NS (CAST SUPPLIES) ×2
PADDING CAST ABS COTTON 3X4 (CAST SUPPLIES) IMPLANT
PADDING CAST ABS COTTON 4X4 ST (CAST SUPPLIES) ×1 IMPLANT
PADDING CAST COTTON 3X4 STRL (CAST SUPPLIES) ×3
SLEEVE SCD COMPRESS KNEE MED (MISCELLANEOUS) ×3 IMPLANT
SPLINT PLASTER CAST XFAST 3X15 (CAST SUPPLIES) IMPLANT
SPLINT PLASTER XTRA FASTSET 3X (CAST SUPPLIES) ×20
STOCKINETTE 4X48 STRL (DRAPES) ×3 IMPLANT
SUT ETHILON 4 0 PS 2 18 (SUTURE) ×5 IMPLANT
SUT SILK 2 0 PERMA HAND 18 BK (SUTURE) ×4 IMPLANT
SYR BULB EAR ULCER 3OZ GRN STR (SYRINGE) ×3 IMPLANT
SYR CONTROL 10ML LL (SYRINGE) ×1 IMPLANT
TOWEL GREEN STERILE FF (TOWEL DISPOSABLE) ×4 IMPLANT
UNDERPAD 30X36 HEAVY ABSORB (UNDERPADS AND DIAPERS) ×3 IMPLANT

## 2020-06-06 NOTE — Anesthesia Preprocedure Evaluation (Addendum)
Anesthesia Evaluation  Patient identified by MRN, date of birth, ID band Patient awake    Reviewed: Allergy & Precautions, NPO status , Patient's Chart, lab work & pertinent test results  Airway Mallampati: II  TM Distance: >3 FB Neck ROM: Full    Dental  (+) Edentulous Upper, Partial Lower   Pulmonary former smoker,    breath sounds clear to auscultation       Cardiovascular  Rhythm:Regular Rate:Normal     Neuro/Psych    GI/Hepatic   Endo/Other    Renal/GU      Musculoskeletal   Abdominal   Peds  Hematology   Anesthesia Other Findings   Reproductive/Obstetrics                             Anesthesia Physical Anesthesia Plan  ASA: II  Anesthesia Plan: Regional and MAC   Post-op Pain Management:    Induction: Intravenous  PONV Risk Score and Plan: Ondansetron and Dexamethasone  Airway Management Planned: Natural Airway and Simple Face Mask  Additional Equipment:   Intra-op Plan:   Post-operative Plan:   Informed Consent: I have reviewed the patients History and Physical, chart, labs and discussed the procedure including the risks, benefits and alternatives for the proposed anesthesia with the patient or authorized representative who has indicated his/her understanding and acceptance.       Plan Discussed with: Anesthesiologist and CRNA  Anesthesia Plan Comments:         Anesthesia Quick Evaluation

## 2020-06-06 NOTE — Anesthesia Procedure Notes (Signed)
Anesthesia Regional Block: Supraclavicular block   Pre-Anesthetic Checklist: ,, timeout performed, Correct Patient, Correct Site, Correct Laterality, Correct Procedure, Correct Position, site marked, Risks and benefits discussed,  Surgical consent,  Pre-op evaluation,  At surgeon's request and post-op pain management  Laterality: Right  Prep: chloraprep       Needles:  Injection technique: Single-shot  Needle Type: Stimulator Needle - 80          Additional Needles:   Procedures:, nerve stimulator,,,,,,,  Narrative:  Start time: 06/06/2020 9:35 AM End time: 06/06/2020 9:40 AM Injection made incrementally with aspirations every 5 mL.  Performed by: Personally   Additional Notes: 20 cc 0.75% Ropivacaine injected easily

## 2020-06-06 NOTE — Discharge Instructions (Addendum)
Post Anesthesia Home Care Instructions  Activity: Get plenty of rest for the remainder of the day. A responsible individual must stay with you for 24 hours following the procedure.  For the next 24 hours, DO NOT: -Drive a car -Paediatric nurse -Drink alcoholic beverages -Take any medication unless instructed by your physician -Make any legal decisions or sign important papers.  Meals: Start with liquid foods such as gelatin or soup. Progress to regular foods as tolerated. Avoid greasy, spicy, heavy foods. If nausea and/or vomiting occur, drink only clear liquids until the nausea and/or vomiting subsides. Call your physician if vomiting continues.  Special Instructions/Symptoms: Your throat may feel dry or sore from the anesthesia or the breathing tube placed in your throat during surgery. If this causes discomfort, gargle with warm salt water. The discomfort should disappear within 24 hours.   [facility] Center Instructions Hand Surgery  Wound Care: Keep your hand elevated above the level of your heart.  Do not allow it to dangle by your side.  Keep the dressing dry and do not remove it unless your doctor advises you to do so.  He will usually change it at the time of your post-op visit.  Moving your fingers is advised to stimulate circulation but will depend on the site of your surgery.  If you have a splint applied, your doctor will advise you regarding movement.  Activity: Do not drive or operate machinery today.  Rest today and then you may return to your normal activity and work as indicated by your physician.  Diet:  Drink liquids today or eat a light diet.  You may resume a regular diet tomorrow.    General expectations: Pain for two to three days. Fingers may become slightly swollen.  Call your doctor if any of the following occur: Severe pain not relieved by pain medication. Elevated temperature. Dressing soaked with blood. Inability to move fingers. White or bluish color  to fingers.   Regional Anesthesia Blocks  1. Numbness or the inability to move the "blocked" extremity may last from 3-48 hours after placement. The length of time depends on the medication injected and your individual response to the medication. If the numbness is not going away after 48 hours, call your surgeon.  2. The extremity that is blocked will need to be protected until the numbness is gone and the  Strength has returned. Because you cannot feel it, you will need to take extra care to avoid injury. Because it may be weak, you may have difficulty moving it or using it. You may not know what position it is in without looking at it while the block is in effect.  3. For blocks in the legs and feet, returning to weight bearing and walking needs to be done carefully. You will need to wait until the numbness is entirely gone and the strength has returned. You should be able to move your leg and foot normally before you try and bear weight or walk. You will need someone to be with you when you first try to ensure you do not fall and possibly risk injury.  4. Bruising and tenderness at the needle site are common side effects and will resolve in a few days.  5. Persistent numbness or new problems with movement should be communicated to the surgeon or the [place] [phone] [place] [phone]).  Call your surgeon if you experience:   1.  Fever over 101.0. 2.  Inability to urinate.  3.  Nausea and/or vomiting. 4.  Extreme swelling or bruising at the surgical site. 5.  Continued bleeding from the incision. 6.  Increased pain, redness or drainage from the incision. 7.  Problems related to your pain medication. 8.  Any problems and/or concerns

## 2020-06-06 NOTE — Transfer of Care (Signed)
Immediate Anesthesia Transfer of Care Note  Patient: Anthony Duke  Procedure(s) Performed: FASCIECTOMY RIGHT SMALL FINGER (Right Arm Lower)  Patient Location: PACU  Anesthesia Type:MAC and Regional  Level of Consciousness: drowsy, patient cooperative and responds to stimulation  Airway & Oxygen Therapy: Patient Spontanous Breathing and Patient connected to face mask oxygen  Post-op Assessment: Report given to RN and Post -op Vital signs reviewed and stable  Post vital signs: Reviewed and stable  Last Vitals:  Vitals Value Taken Time  BP    Temp    Pulse    Resp    SpO2      Last Pain:  Vitals:   06/06/20 0856  TempSrc: Oral  PainSc: 0-No pain         Complications: No complications documented.

## 2020-06-06 NOTE — Op Note (Signed)
NAME: DAVEYON KITCHINGS MEDICAL RECORD NO: [id number] DATE OF BIRTH: [birth year] FACILITY: [facility] LOCATION: [facility] PHYSICIAN: Wynonia Sours, MD   OPERATIVE REPORT   DATE OF PROCEDURE: 06/06/20    PREOPERATIVE DIAGNOSIS:   Dupuytren's contracture right small finger   POSTOPERATIVE DIAGNOSIS:   Same   PROCEDURE:   Palmar fasciectomy with V-Y advancement right small finger   SURGEON: Daryll Brod, M.D.   ASSISTANT: Leverne Humbles, [facility]   ANESTHESIA:  Regional with sedation   INTRAVENOUS FLUIDS:  Per anesthesia flow sheet.   ESTIMATED BLOOD LOSS:  Minimal.   COMPLICATIONS:  None.   SPECIMENS:   Palmar fascia   TOURNIQUET TIME:    Total Tourniquet Time Documented: Upper Arm (Right) - 52 minutes Total: Upper Arm (Right) - 52 minutes    DISPOSITION:  Stable to PACU.   INDICATIONS: Patient is a 64 year old male with a history of Dupuytren's contracture with significant deformity of his right small finger with flexion of approximately 60 degrees at the MP 80 degrees at the PIP joint.  He is desirous of removal and attempt to improve the extension of his finger but does not want to have a joint release performed.  Pre-peripostoperative course been discussed along with risk complications.  He is aware there is no guarantee to the surgery the possibility of infection recurrence injury to arteries nerves tendons incomplete relief symptoms dystrophy.  He is advised the potential for even a loss of a finger.  He is advised of the recurrence rates.  Preoperative area the patient is seen extremity marked by both patient and surgeon antibiotic given a supraclavicular block was carried out without difficulty under the direction the anesthesia department.  OPERATIVE COURSE: Patient is brought to the operating room where he was placed in the supine position prepped and draped with the right arm free.  Prep was done with ChloraPrep 3-minute dry time was allowed timeout taken to  confirm patient procedure.  The limb was exsanguinated with an Esmarch bandage turn placed on the arm was inflated to 250 mmHg.  A volar Bruner incision was made beginning proximally carried down through subcutaneous tissue allowing visualization of the attachment of the palmar fascia at the distal flexor retinaculum.  This was undermined protecting neurovascular bundles deep to this the palmar fascia attachment was then released to the ring small and middle fingers.  The dissection was carried distally after identifying neurovascular bundles protecting these throughout the procedure.  The incision was extended all the way to the middle phalanx.  The cord was isolated protecting neurovascular bundles radially and ulnarly over the entire course.  Abductor digiti quinti cord was present.  The entire cord was removed distally.  Neurovascular bundles were then explored found to be intact over their entire course.  The small finger PIP joint came to approximately 20 degrees of full extension.  The wound was copious irrigated with salineV's were converted to Y's.  The wound was bathed in thrombin.  Bleeders were electrocauterized as necessary with bipolar.  The wound was then closed over a doubled over vessel loop drain with interrupted 4-0 nylon sutures.  There was one portion at the PIP joint where the skin was broken through.  Silvadene was placed on this portion of the wound.  A sterile compressive dressing was applied.  The tourniquet was deflated all fingers immediately pinked the completion of the dressing with dorsal splint was applied.  The patient tolerated the procedure well was taken to the recovery  room for observation in satisfactory condition.  The cord was sent to pathology.  Patient is discharged home to return to the hand center of [facility] in 1 week on Tylenol ibuprofen for pain with Ultram for breakthrough.   Daryll Brod, MD Electronically signed, 06/06/20

## 2020-06-06 NOTE — Brief Op Note (Signed)
06/06/2020  11:18 AM  PATIENT:  Marily Memos  64 y.o. male  PRE-OPERATIVE DIAGNOSIS:  DUPUYTREN'S RIGHT SMALL FINGER  POST-OPERATIVE DIAGNOSIS:  DUPUYTREN'S RIGHT SMALL FINGER  PROCEDURE:  Procedure(s) with comments: FASCIECTOMY RIGHT SMALL FINGER (Right) - AXILLARY BLOCK  SURGEON:  Surgeon(s) and Role:    * Daryll Brod, MD - Primary  PHYSICIAN ASSISTANT:   ASSISTANTS: R Dasnoit,PAC   ANESTHESIA:   regional and IV sedation  EBL: 1ml  BLOOD ADMINISTERED:none  DRAINS: none   LOCAL MEDICATIONS USED:  NONE  SPECIMEN:  Excision  DISPOSITION OF SPECIMEN:  PATHOLOGY  COUNTS:  YES  TOURNIQUET:   Total Tourniquet Time Documented: Upper Arm (Right) - 52 minutes Total: Upper Arm (Right) - 52 minutes   DICTATION: .Viviann Spare Dictation  PLAN OF CARE: Discharge to home after PACU  PATIENT DISPOSITION:  PACU - hemodynamically stable.

## 2020-06-06 NOTE — Anesthesia Procedure Notes (Signed)
Date/Time: 06/06/2020 10:11 AM Performed by: Glory Buff, CRNA Oxygen Delivery Method: Simple face mask

## 2020-06-06 NOTE — H&P (Signed)
  Anthony Duke is an 64 y.o. male.   Chief Complaint: Dupuytren's contracture right small finger HPI: Anthony Duke is a 64 year old right-hand-dominant male comes in complaining of the inability to extend his right small finger. He states is been going on for 2 years. Recalls no history of injury. He does not know what his ancestry is. He does not recall parents or siblings having finger pulled down. He does not complain of any pain or discomfort. He does not have lumps on his feet or curvature of his penis. He has not had any treatment or tried anything for this. He has no history of diabetes thyroid problems arthritis gout. Family history is negative for each of these also.   Past Medical History:  Diagnosis Date  . History of diverticulitis of colon    10-06-2019  per pt treated with medication ,  approx. 2006  . Wears glasses     Past Surgical History:  Procedure Laterality Date  . INGUINAL HERNIA REPAIR Bilateral 10/13/2019   Procedure: LAPAROSCOPIC RIGHT  INGUINAL AND BILATERAL FEMORAL  HERNIA  REPAIR  WITH MESH;  Surgeon: Michael Boston, MD;  Location: [facility];  Service: General;  Laterality: Bilateral;  . LAPAROSCOPIC INGUINAL HERNIA REPAIR Left 08/ 2010  Dr Barry Dienes    No family history on file. Social History:  reports that he quit smoking about 2 months ago. His smoking use included cigarettes. He has a 5.00 pack-year smoking history. He has never used smokeless tobacco. He reports current alcohol use. He reports that he does not use drugs.  Allergies: No Known Allergies  No medications prior to admission.    No results found for this or any previous visit (from the past 48 hour(s)).  No results found.   Pertinent items are noted in HPI.  Height 5\' 9"  (1.753 m), weight 70.3 kg.  General appearance: alert, cooperative and appears stated age Head: Normocephalic, without obvious abnormality Neck: no adenopathy and no JVD Resp: clear to auscultation  bilaterally Cardio: regular rate and rhythm, S1, S2 normal, no murmur, click, rub or gallop GI: soft, non-tender; bowel sounds normal; no masses,  no organomegaly Extremities: Flexion contracture right small finger Pulses: 2+ and symmetric Skin: Skin color, texture, turgor normal. No rashes or lesions Neurologic: Grossly normal Incision/Wound: na  Assessment/Plan Assessment:  1. Contracture of palmar fascia    Plan: He would like to proceed. Preperi-and postoperative course been discussed along with risks and complications. He is aware there is no guarantee to the surgery the possibility of infection recurrence injury to arteries nerves tendons complete relief symptoms dystrophy loss of fingers recurrence of cords possibility of numbness and tingling. He is scheduled for fasciectomy right small finger only as an outpatient under regional anesthesia. He is willing to accept some deformity of the PIP joint in preference to not have to do a full release of the joint. He is discussed as to why this is. He is aware that there is going to be only a 50% correction.   Daryll Brod 06/06/2020, 4:50 AM

## 2020-06-06 NOTE — Progress Notes (Signed)
Assisted Dr. Linna Caprice with right, ultrasound guided axillary block. Side rails up, monitors on throughout procedure. See vital signs in flow sheet. Tolerated Procedure well.

## 2020-06-07 ENCOUNTER — Encounter (HOSPITAL_BASED_OUTPATIENT_CLINIC_OR_DEPARTMENT_OTHER): Payer: Self-pay | Admitting: Orthopedic Surgery

## 2020-06-07 LAB — SURGICAL PATHOLOGY

## 2020-06-07 NOTE — Anesthesia Postprocedure Evaluation (Signed)
Anesthesia Post Note  Patient: Anthony Duke  Procedure(s) Performed: FASCIECTOMY RIGHT SMALL FINGER (Right Arm Lower)     Patient location during evaluation: PACU Anesthesia Type: Regional Level of consciousness: awake and alert Pain management: pain level controlled Vital Signs Assessment: post-procedure vital signs reviewed and stable Respiratory status: spontaneous breathing, nonlabored ventilation, respiratory function stable and patient connected to nasal cannula oxygen Cardiovascular status: stable and blood pressure returned to baseline Postop Assessment: no apparent nausea or vomiting Anesthetic complications: no   No complications documented.  Last Vitals:  Vitals:   06/06/20 1145 06/06/20 1200  BP: (!) 147/85 (!) 141/81  Pulse: 72 63  Resp: 17 16  Temp:  36.6 C  SpO2: 95% 95%    Last Pain:  Vitals:   06/06/20 1200  TempSrc:   PainSc: 0-No pain                 Thuan Tippett COKER

## 2020-12-11 ENCOUNTER — Other Ambulatory Visit: Payer: Self-pay | Admitting: Urology

## 2024-06-16 LAB — COLOGUARD: COLOGUARD: POSITIVE — AB
